# Patient Record
Sex: Female | Born: 1960 | Race: White | Hispanic: No | Marital: Married
Health system: Midwestern US, Academic
[De-identification: ages and names within clinical notes are randomized; demographics above are authoritative.]

---

## 2017-08-16 ENCOUNTER — Encounter: Admit: 2017-08-16 | Discharge: 2017-08-17 | Payer: Commercial Managed Care - HMO

## 2017-10-25 ENCOUNTER — Encounter: Admit: 2017-10-25 | Discharge: 2017-10-26 | Payer: Commercial Managed Care - HMO

## 2017-12-18 ENCOUNTER — Encounter: Admit: 2017-12-18 | Discharge: 2017-12-18 | Payer: Commercial Managed Care - HMO

## 2017-12-18 DIAGNOSIS — M79641 Pain in right hand: Principal | ICD-10-CM

## 2017-12-26 ENCOUNTER — Ambulatory Visit: Admit: 2017-12-26 | Discharge: 2017-12-26 | Payer: Commercial Managed Care - HMO

## 2017-12-26 ENCOUNTER — Encounter: Admit: 2017-12-26 | Discharge: 2017-12-26 | Payer: Commercial Managed Care - HMO

## 2017-12-26 DIAGNOSIS — M359 Systemic involvement of connective tissue, unspecified: ICD-10-CM

## 2017-12-26 DIAGNOSIS — M79641 Pain in right hand: Principal | ICD-10-CM

## 2017-12-26 DIAGNOSIS — T4145XA Adverse effect of unspecified anesthetic, initial encounter: Principal | ICD-10-CM

## 2017-12-26 DIAGNOSIS — M79642 Pain in left hand: ICD-10-CM

## 2018-01-27 ENCOUNTER — Encounter: Admit: 2018-01-27 | Discharge: 2018-01-27 | Payer: Commercial Managed Care - HMO

## 2019-04-12 IMAGING — MG MAMMOGRAM 3D SCREEN, BILATERAL
12 of 16 series · 12 of 16 positions shown · non-contrast
Comparison: none

[R CC (1 of 2)]
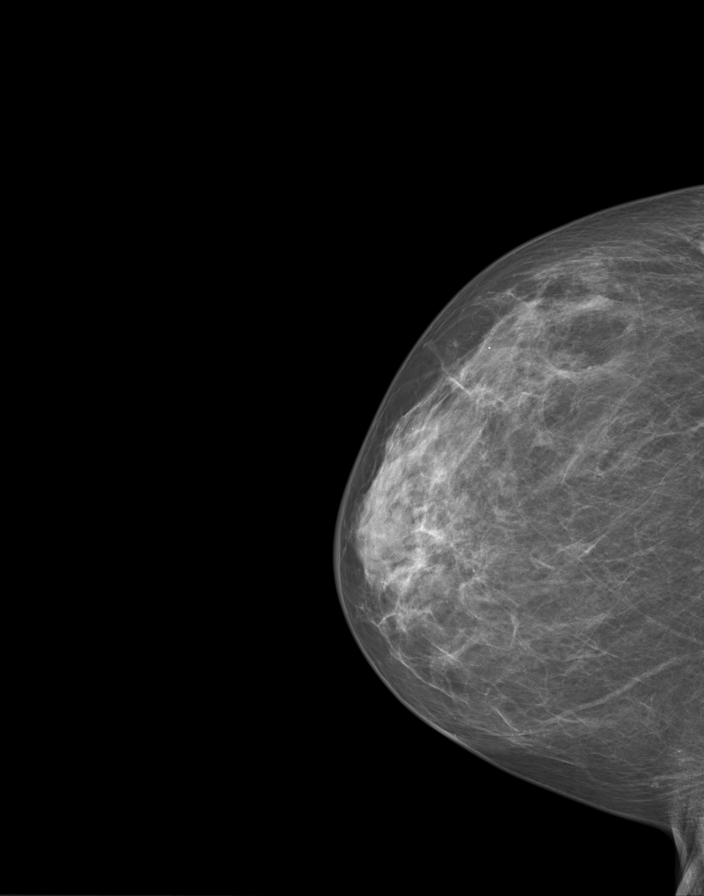

[R tomo (1 of 2)]
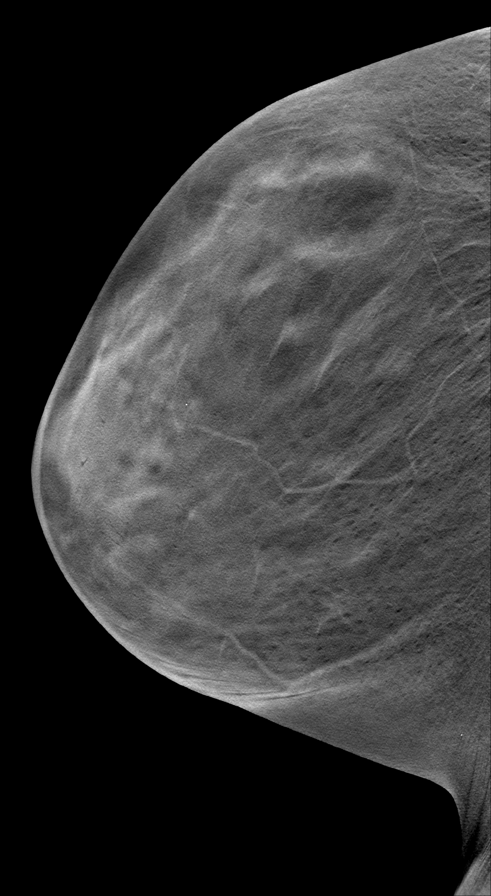

[R CC (2 of 2)]
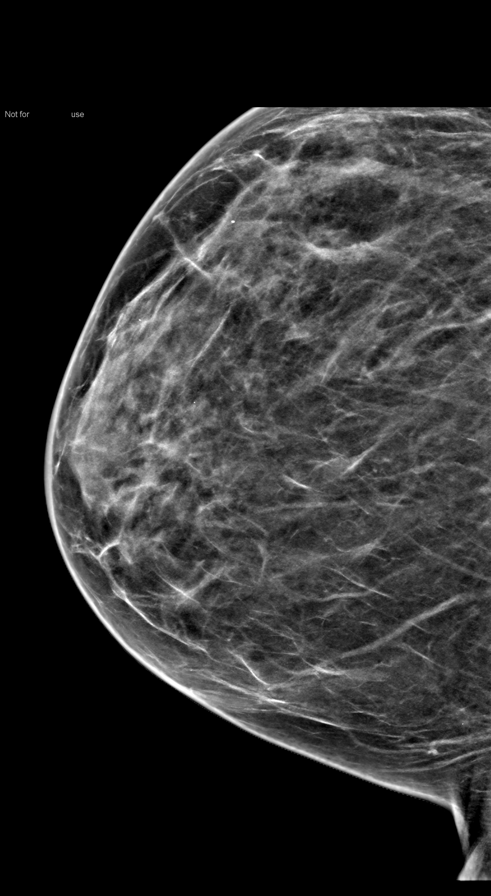

[R (1 of 2)]
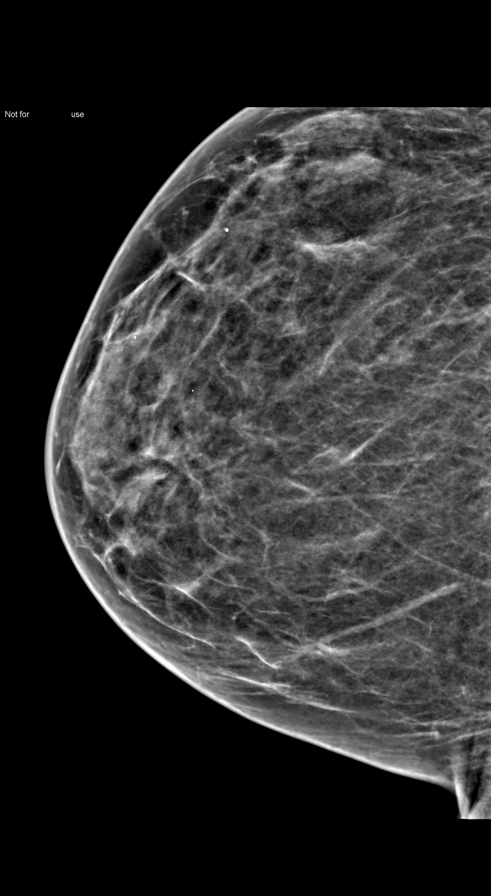

[L CC (1 of 2)]
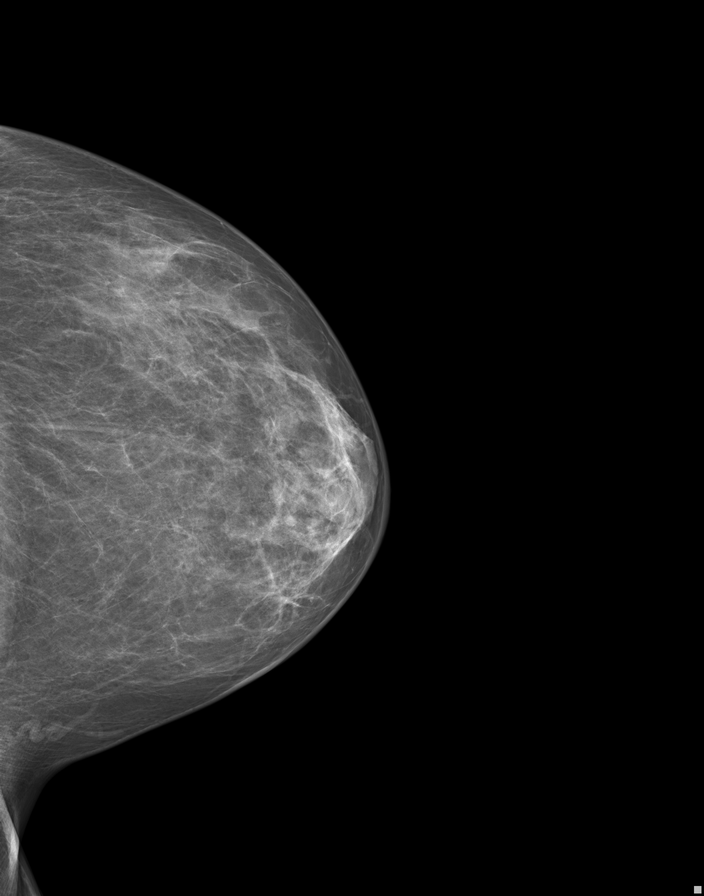

[L tomo]
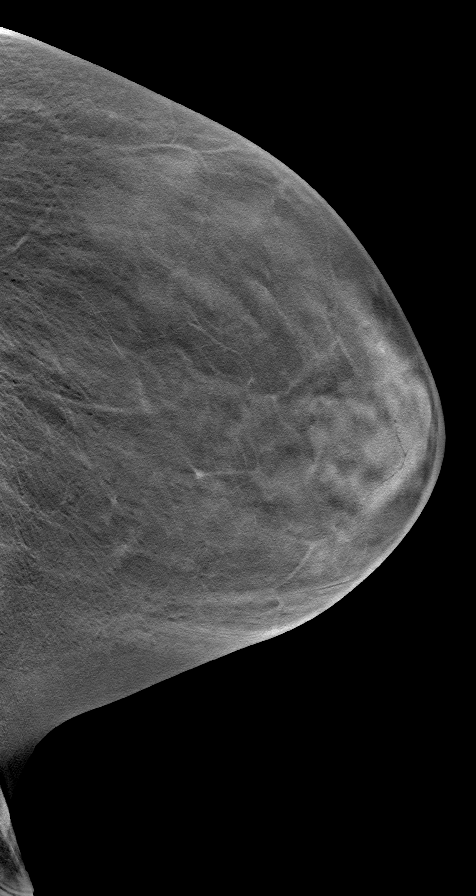

[L CC (2 of 2)]
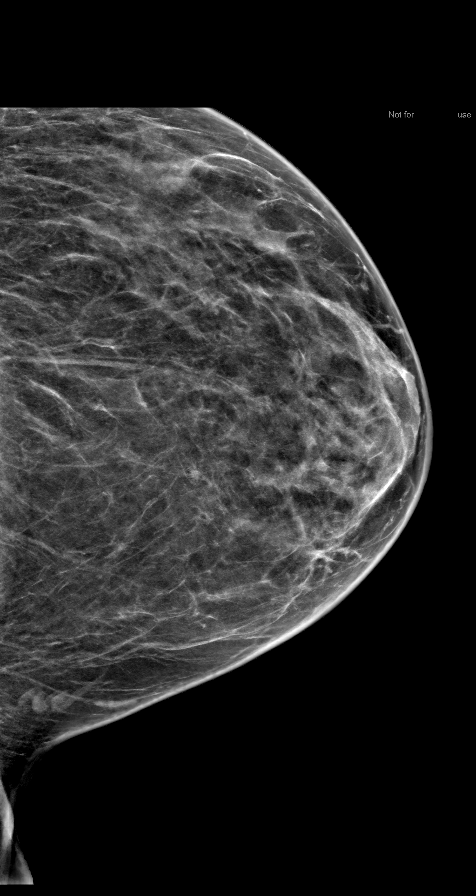

[L]
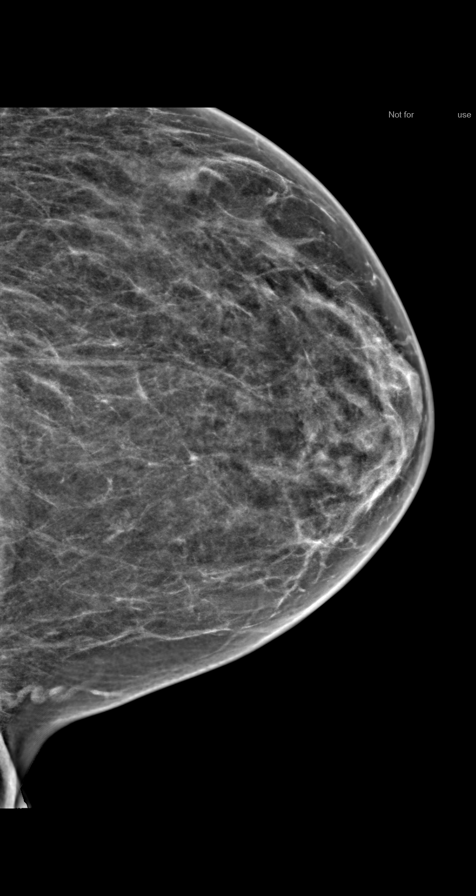

[R MLO (1 of 2)]
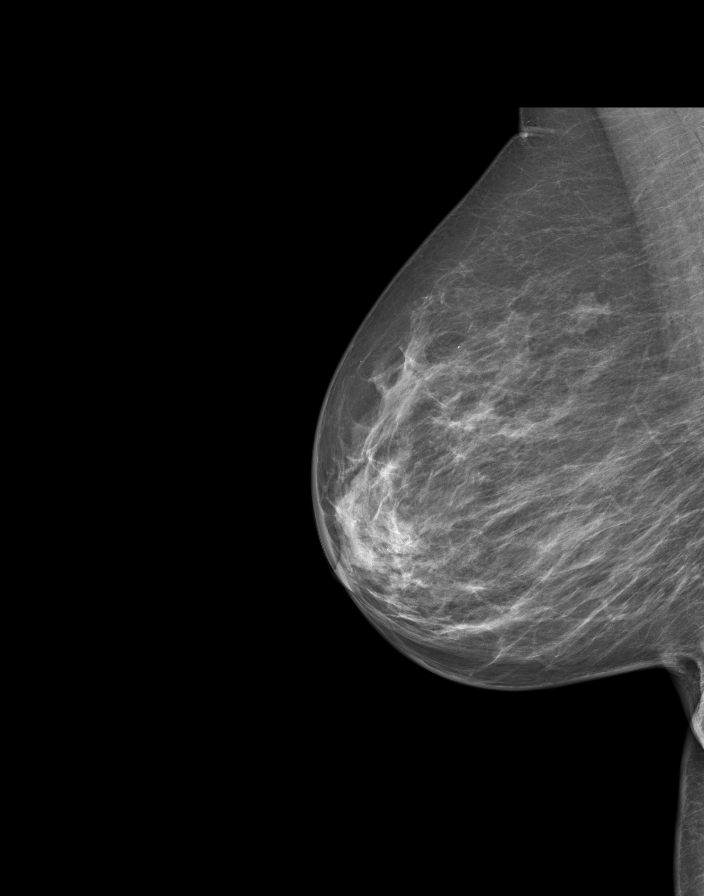

[R tomo (2 of 2)]
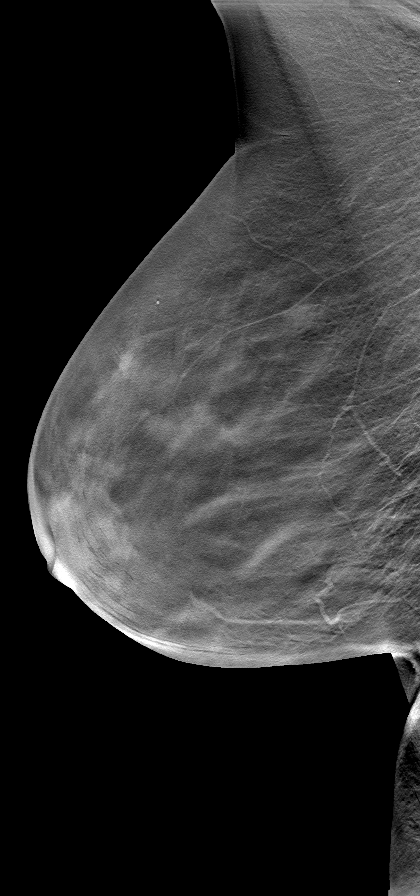

[R MLO (2 of 2)]
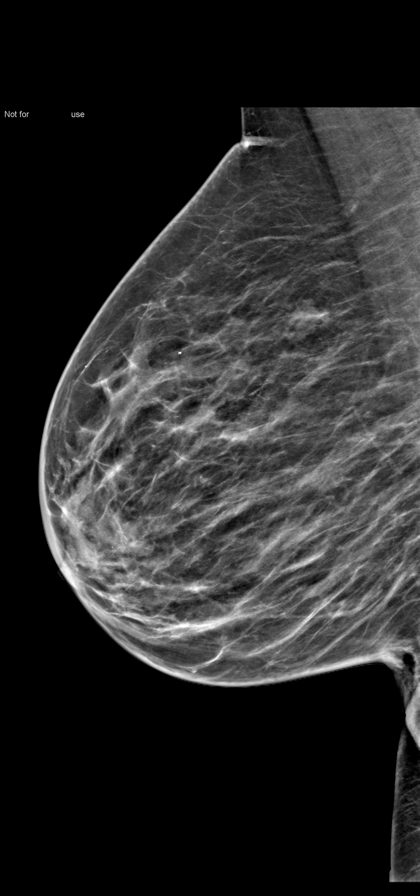

[R (2 of 2)]
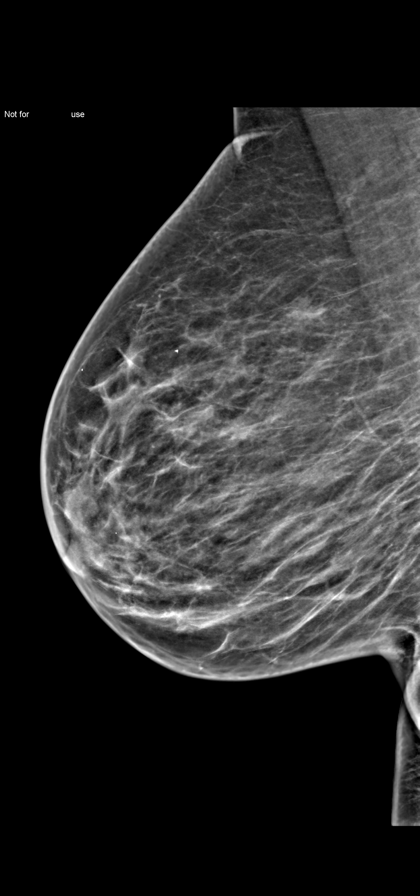

[12 of 16 positions shown; findings below may reference images not displayed]

EXAM
3D SCREENING MAMMOGRAM, BILATERAL

INDICATION
screening
Sister DX @ 60, 2 PAT AUNTS @ 50s, PAT GMA @50. INVERTED NIPPLES NOT NEW, DISCHARGE OFF AND ON.
SKIN CA 5449. SCR (3D) AB PRIORS: BEING REQUESTED FERIENHAUS ERXLEBEN

TECHNIQUE
Digital 2D CC and MLO projections obtained with 3D tomographic views per manufacturer's protocol.
ICAD version 7.2 was used during this exam.

COMPARISONS
January 02, 2017

FINDINGS
The breasts are stable and unchanged. No concerning masses or calcifications are seen.

IMPRESSION
Stable bilateral mammography. One year follow-up recommended.BI-RADS 1, NEGATIVE.

Tech Notes:

## 2019-07-08 ENCOUNTER — Ambulatory Visit: Admit: 2019-07-08 | Discharge: 2019-07-08 | Payer: Commercial Managed Care - HMO

## 2019-07-08 ENCOUNTER — Encounter: Admit: 2019-07-08 | Discharge: 2019-07-08 | Payer: Commercial Managed Care - HMO

## 2019-07-08 DIAGNOSIS — R159 Full incontinence of feces: Secondary | ICD-10-CM

## 2019-07-08 DIAGNOSIS — M359 Systemic involvement of connective tissue, unspecified: Secondary | ICD-10-CM

## 2019-07-08 DIAGNOSIS — R300 Dysuria: Secondary | ICD-10-CM

## 2019-07-08 DIAGNOSIS — T8859XA Other complications of anesthesia, initial encounter: Secondary | ICD-10-CM

## 2019-07-08 DIAGNOSIS — R3989 Other symptoms and signs involving the genitourinary system: Secondary | ICD-10-CM

## 2019-07-08 DIAGNOSIS — M7918 Myalgia, other site: Secondary | ICD-10-CM

## 2019-07-08 DIAGNOSIS — N3946 Mixed incontinence: Secondary | ICD-10-CM

## 2019-07-08 DIAGNOSIS — N952 Postmenopausal atrophic vaginitis: Secondary | ICD-10-CM

## 2019-07-08 MED ORDER — ESTRADIOL 0.01 % (0.1 MG/GRAM) VA CREA
2 refills | 43.00000 days | Status: AC
Start: 2019-07-08 — End: ?

## 2019-07-08 MED ORDER — METHEN-M.BLUE-S.PHOS-PHSAL-HYO 118-10-40.8-36 MG PO CAP
1 | ORAL_CAPSULE | Freq: Four times a day (QID) | ORAL | 1 refills | 57.00000 days | Status: DC | PRN
Start: 2019-07-08 — End: 2019-08-13

## 2019-07-09 ENCOUNTER — Encounter: Admit: 2019-07-09 | Discharge: 2019-07-09 | Payer: Commercial Managed Care - HMO

## 2019-07-09 NOTE — Telephone Encounter
Rec'd incoming fax from pt's pharmacy stating patient requests new Rx- high copay on the Uro-MP- any alternative?

## 2019-07-09 NOTE — Telephone Encounter
Hand, Monica L, PA-C  You 51 minutes ago (2:10 PM)     She is not on Medicare so might be able to try Goodrx     Spoke to pt. Suggested GoodRx and possibly uribel coupon in our office. GoodRx showed coupons available via [facility]. Pt will try this.

## 2019-07-09 NOTE — Progress Notes
Faxed PT referral to [facility]- fx [phone]. Received successful confirmation.

## 2019-07-11 ENCOUNTER — Encounter: Admit: 2019-07-11 | Discharge: 2019-07-11 | Payer: Commercial Managed Care - HMO

## 2019-07-11 MED ORDER — LEVOFLOXACIN 250 MG PO TAB
250 mg | ORAL_TABLET | Freq: Every day | ORAL | 0 refills | 7.00000 days | Status: AC
Start: 2019-07-11 — End: ?

## 2019-07-11 NOTE — Telephone Encounter
Called patient to discuss her results of her urine culture. ( see lab results) .

## 2019-07-17 ENCOUNTER — Encounter: Admit: 2019-07-17 | Discharge: 2019-07-17 | Payer: Commercial Managed Care - HMO

## 2019-07-17 ENCOUNTER — Ambulatory Visit: Admit: 2019-07-17 | Discharge: 2019-07-17 | Payer: Commercial Managed Care - HMO

## 2019-07-17 LAB — COMPREHENSIVE METABOLIC PANEL
Lab: 0.6 mg/dL (ref 0.3–1.2)
Lab: 0.7 mg/dL (ref 0.4–1.00)
Lab: 101 MMOL/L (ref 98–110)
Lab: 139 MMOL/L (ref 137–147)
Lab: 26 mg/dL — ABNORMAL HIGH (ref 7–25)
Lab: 28 U/L (ref 7–40)
Lab: 31 MMOL/L — ABNORMAL HIGH (ref 21–30)
Lab: 33 U/L (ref 7–56)
Lab: 4.1 MMOL/L (ref 3.5–5.1)
Lab: 4.2 g/dL (ref 3.5–5.0)
Lab: 49 U/L (ref 25–110)
Lab: 6.9 g/dL (ref 6.0–8.0)
Lab: 9.5 mg/dL (ref 8.5–10.6)
Lab: 94 mg/dL (ref 70–100)
Lab: >60 mL/min (ref >60)
Lab: >60 mL/min (ref >60)
Lab: 7 (ref 3–12)

## 2019-07-17 MED ORDER — PHENAZOPYRIDINE 200 MG PO TAB
200 mg | ORAL_TABLET | Freq: Three times a day (TID) | ORAL | 1 refills | Status: DC | PRN
Start: 2019-07-17 — End: 2019-08-13

## 2019-07-17 NOTE — Progress Notes
Please contact pt with result.  CMP shows normal kidney function. BUN just slightly elevated, but other markers being normal is reassuring. Please ask her to increase her water intake.   For UTI, please RX levaquin 750 mg once daily x 5 days. If she still does not feel better after 48 hours of increased dose, please instruct her to call office.

## 2019-07-17 NOTE — Progress Notes
Pertinent Review of Systems:  - General ROS: Denies recent weight change above 10 pounds, Denies malaise  - HEENT ROS: Denies blurring of vision. Denies double vision, Denies glaucoma, Denies dry eyes  - Cardiovascular ROS: Denies chest pain. Denies palpitations currently. Denies orthopnea  - Respiratory ROS: Denies shortness of breath,  Denies cough,  Denies wheezing currently  - Gastrointestinal ROS: Denies constipation, Denies gastric reflux,  Denies blood in stool, Denies diarrhea, Denies Irritable bowel syndrome, Denies nausea, Denies vomiting, Denies abdominal pain  - Endocrine ROS: DeniesPOS polydipsia,  Denies excessive sweating. DeniesPOS hot flashes. Denies Thyroid disease  - Hematological and Lymphatic ROS: Denies easy bruisability , Denies current anemia, Denies adenopathy in the inguinal area  - Neurological ROS: Denies syncope, Denies numbness in lower extremities, Denies neuropathy. Denies shooting pain down the legs, Denies shooting pain in the lower back, DeniesPOS low back pain  - Musculoskeletal ROS: Denies Joint pain. Denies Edema in lower extremities. DeniesPOS fibromyalgia  - Psychological ROS: Denies depression, Denies anxiety, Denies thoughts of suicide, Denies thoughts of homicide, Denies recent seizure, Denies syncope   - Dermatological ROS: Denies eczema. Denies skin rashes in the groin area and other sites

## 2019-07-17 NOTE — Progress Notes
CHIEF COMPLAINT:   Chief Complaint   Patient presents with   ? Bladder infection       Janice Gregory is a 59 y.o.,  female, who presents for follow up regarding UTI.    On day 6/7 of levaquin and UTI symptoms persisting with dysuria, bladder pain (esp with hitting bumps in the road on drive here), increased urinary urgency and frequency. Denies gross hematuria. Afebrile.    We had discussed possibly administering gentamicin injection today. She reports her last blood work showed elev Cr, BUN and low eGFR, which was attributed possibly to NSAIDS. She has not had follow-up blood work since then.       ALLERGIES:  Patient has no known allergies.    MEDICATIONS:    Current Outpatient Medications:   ?  ASTAXANTHIN PO, Take 12 mg by mouth., Disp: , Rfl:   ?  diclofenac (VOLTAREN) 1 % topical gel, Apply 4 g topically to affected area four times daily., Disp: , Rfl:   ?  estradioL (ESTRACE) 0.01 % (0.1 mg/g) vaginal cream, For 14 days, insert/apply 0.5 g (1/2 fingertip unit) intravaginally and to vaginal opening once nightly. Then, apply 1 g (1 fingertip unit) in the same way twice weekly at bedtime., Disp: 42.5 g, Rfl: 2  ?  FLAXSEED OIL PO, Take  by mouth., Disp: , Rfl:   ?  levoFLOXacin (LEVAQUIN) 250 mg tablet, Take one tablet by mouth daily for 7 days., Disp: 7 tablet, Rfl: 0  ?  methenamine-sod phos-phenyl salicy-methylene blue-hyoscy (URIBEL) 118-10-40.8-36 mg capsule, Take one capsule by mouth four times daily as needed., Disp: 100 capsule, Rfl: 1  ?  mv-mn-folic ac-vit K-herb 289 (ALIVE ONCE DAILY WOMEN 50 PLUS) 800-100 mcg tab, Take  by mouth., Disp: , Rfl:   ?  phenazopyridine (PYRIDIUM) 200 mg tablet, Take one tablet by mouth three times daily as needed for Pain. Take after meals for up to 2 days., Disp: 12 tablet, Rfl: 1  ?  polyethylene glycol 3350 (MIRALAX) 17 g packet, Take 17 g by mouth daily., Disp: , Rfl:   ?  Safflower Oil-Linoleic Acid,Co (CLA) 1,000 mg cap, Take  by mouth., Disp: , Rfl:   ?  safflower oil/linoleic acid,co (CLA PO), Take 1,300 mg by mouth twice daily., Disp: , Rfl:   ?  traMADol (ULTRAM) 50 mg tablet, Take 50 mg by mouth every 6 hours as needed for Pain., Disp: , Rfl:   ?  vit C/E/zinc/lutein/zeaxanthin (OCUVITE EYE HEALTH PO), Take  by mouth., Disp: , Rfl:     Medical History:   Diagnosis Date   ? Complication of anesthesia    ? 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 ARTHROPLASTY     ? FOOT SURGERY     ? HERNIA REPAIR     ? HX APPENDECTOMY     ? HX CHOLECYSTECTOMY     ? HX HEMORRHOIDECTOMY     ? HX JOINT REPLACEMENT     ? HYSTERECTOMY     ? KNEE SURGERY         OBJECTIVE:    Vitals:    07/17/19 1024   BP: 128/73   BP Source: Arm, Right Upper   Patient Position: Sitting   Pulse: 79   Resp: 16   Temp: 36.9 ?C (98.4 ?F)   SpO2: 95%   Weight: 78.7 kg (173 lb 6.4 oz)   Height: 170.2 cm (  67)   PainSc: One       Physical Exam  Constitutional:       General: She is not in acute distress.     Appearance: Normal appearance. She is not ill-appearing.   HENT:      Head: Normocephalic and atraumatic.   Cardiovascular:      Rate and Rhythm: Normal rate.   Pulmonary:      Effort: Pulmonary effort is normal. No respiratory distress.   Neurological:      General: No focal deficit present.      Mental Status: She is alert.   Psychiatric:         Mood and Affect: Mood normal.         Behavior: Behavior normal.         Thought Content: Thought content normal.         Judgment: Judgment normal.   Vitals signs reviewed.          IMPRESSION  59 y.o. female, with history of low eGFR per pt report, with acute cystitis with urine culture positive for pseudomonas aeruginosa and E coli, symptoms persisting with treatment with Levaquin 250 once daily.     PLAN  CMP to follow-up pt report of decreased kidney function. Had considered gentamicin injection, but given potential for nephrotoxicity, will defer until CMP result. If eGFR/Cr WNL, will rx levaquin 750 mg once daily. Per review of literature regarding pseudomonas aeruginosa infection, may require higher dose of fluoroquinolone, so I suspect our initial dose was too low. If she again does not improve after 48 hours of levaquin, will consider upper tract imaging for evaluation of other cause and/or ID consultation.   For symptomatic relief, RX phenazopyridine (PYRIDIUM) 200 mg tablet; Take one tablet by mouth three times daily as needed for Pain. Take after meals for up to 2 days.  Dispense: 12 tablet; Refill: 1. If cost-prohibitive, she was instructed to purschase Azo urinary pain relief OTC.   Repeat urine c/s given continued UTI symptoms

## 2019-07-18 ENCOUNTER — Encounter: Admit: 2019-07-18 | Discharge: 2019-07-18 | Payer: Commercial Managed Care - HMO

## 2019-07-18 MED ORDER — LEVOFLOXACIN 750 MG PO TAB
750 mg | ORAL_TABLET | Freq: Every day | ORAL | 0 refills | 7.00000 days | Status: AC
Start: 2019-07-18 — End: ?

## 2019-07-18 NOTE — Telephone Encounter
-----   Message from Dalene Seltzer, PA-C sent at 07/18/2019 12:26 PM CST -----  Please contact pt with result. Urine culture is negative. It's reassuring that the infection is no longer culturing and there are no new bacteria, but I would like her to continue levaquin to finish treating the prior positive culture. I would anticipate her symptoms will start to improve now!

## 2019-07-18 NOTE — Telephone Encounter
Spoke to pt. Relayed results. Pt was appreciative.

## 2019-07-18 NOTE — Telephone Encounter
-----   Message from Dalene Seltzer, PA-C sent at 07/17/2019  3:12 PM CST -----  Please contact pt with result.  CMP shows normal kidney function. BUN just slightly elevated, but other markers being normal is reassuring. Please ask her to increase her water intake.   For UTI, please RX levaquin 750 mg once daily x 5 days. If she still does not feel better after 48 hours of increased dose, please instruct her to call office.

## 2019-07-18 NOTE — Telephone Encounter
Spoke to pt. Relayed results and tx recommendations. Pt will call this office if not feeling better after 48 hours. Pt was appreciative.

## 2019-07-22 ENCOUNTER — Encounter: Admit: 2019-07-22 | Discharge: 2019-07-22 | Payer: Commercial Managed Care - HMO

## 2019-07-22 NOTE — Telephone Encounter
Pt LM stating she has tab of Levaquin left and she is really, no better.     Spoke to pt. Pt confirmed she has had no changes. [place] [employer] [place] and had bladder, throbbing and spasms. Pt stated she could barely make it back to the house before she had to urinate again, even with the red pill (AZO). Advised will discuss with [facility]. Pt v/u.

## 2019-07-25 ENCOUNTER — Encounter: Admit: 2019-07-25 | Discharge: 2019-07-25 | Payer: Commercial Managed Care - HMO

## 2019-07-25 DIAGNOSIS — R3989 Other symptoms and signs involving the genitourinary system: Principal | ICD-10-CM

## 2019-07-25 LAB — URINALYSIS DIPSTICK
Lab: 6 (ref 5.0–8.0)
Lab: NEGATIVE
Lab: 1 (ref 1.003–1.035)
Lab: NEGATIVE
Lab: NEGATIVE
Lab: NEGATIVE
Lab: NEGATIVE
Lab: NEGATIVE
Lab: NEGATIVE
Lab: POSITIVE — AB

## 2019-07-25 LAB — URINALYSIS, MICROSCOPIC

## 2019-08-04 ENCOUNTER — Encounter: Admit: 2019-08-04 | Discharge: 2019-08-04 | Payer: Commercial Managed Care - HMO

## 2019-08-13 ENCOUNTER — Encounter: Admit: 2019-08-13 | Discharge: 2019-08-13 | Payer: Commercial Managed Care - HMO

## 2019-08-13 ENCOUNTER — Ambulatory Visit: Admit: 2019-08-13 | Discharge: 2019-08-13 | Payer: Commercial Managed Care - HMO

## 2019-08-13 DIAGNOSIS — K5900 Constipation, unspecified: Secondary | ICD-10-CM

## 2019-08-13 DIAGNOSIS — T8859XA Other complications of anesthesia, initial encounter: Secondary | ICD-10-CM

## 2019-08-13 DIAGNOSIS — R194 Change in bowel habit: Secondary | ICD-10-CM

## 2019-08-13 DIAGNOSIS — M359 Systemic involvement of connective tissue, unspecified: Secondary | ICD-10-CM

## 2019-08-13 DIAGNOSIS — K5909 Other constipation: Secondary | ICD-10-CM

## 2019-08-13 DIAGNOSIS — R159 Full incontinence of feces: Secondary | ICD-10-CM

## 2019-08-13 DIAGNOSIS — Z9889 Other specified postprocedural states: Secondary | ICD-10-CM

## 2019-08-13 NOTE — Progress Notes
Date of Service: 08/13/2019    Subjective:             Janice Gregory is a 59 y.o. female. 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 She is referred for evaluation of fecal incontinence.PCP; Nicole Cella MD    History of Present Illness  Patient reports longstanding history of severe constipation with 1 bowel movement per week since her early adulthood, especially worsened after her vaginal deliveries.  She has had 3x vaginal births, traumatic birth 10 lbs and forceps use, with second birth 12 months later. 3rd birth was a 3 day induction.  She reports using for fingers and occasionally hand for disimpaction, she also reports issues with vaginal prolapse, undergoing surgeries for that in addition to her surgeries for rectopexy.  She now presents with fecal incontinence that has been very bothersome and disruptive to her quality of life preventing her from going out of the house. She has fecal passage with any type of strenuous work. The fecal incontinence has been getting worse over the last 3 years, and over the last 3 weeks she has had BSS 6-7 almost constantly.  She reports passage of stool each time she urinates (about 20x/d).  She developed fecal incontinence already 2 years prior to her second rectopexy in 2018, and was told by her surgeon that this may get even worse after the second surgery.  To help prevent her from developing severe constipation again, she was told that he had tightened her rectal vault, since then she has been having twizzler size BMs until recently as described above.  Over the last 20 years she has been taking a capful of Miralax on a daily basis.  Since 01/2019 started to develop UTIs and started to see urogynecology at [facility] in March and has been referred to pelvic floor therapy. Past abdominal/pelvic surgeries:  November 1997 [facility] for menorrhagia in [place] complicated by uterine prolapse, hematoma and extensive abdominal infection.  Also underwent hysterectomy at same time as D&C due to prolapse.  December 1997 exploratory laparotomy for abdominal infection and appendectomy  August 1998 incisional hernia repair  July 2000 anterior and posterior vaginal repair  March 2004 rectocele repair, rectopexy, anterior sphincter repair, transvaginal sling.  C/b extensive adhesions per pt.   November 2009 hemorrhoidectomy  July 10, 2016 rectocele repair Dr. Wynelle Link of [facility]  August 07, 2016 hemorrhoidectomy Dr. Shari Heritage of [facility]  January 2002 cholecystectomy c/b adhesions        Medical History:   Diagnosis Date   ? Complication of anesthesia    ? 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 ARTHROPLASTY     ? FOOT SURGERY     ? HERNIA REPAIR     ? HX APPENDECTOMY     ? HX CHOLECYSTECTOMY     ? HX HEMORRHOIDECTOMY     ? HX JOINT REPLACEMENT     ? HYSTERECTOMY     ? KNEE SURGERY         Social History     Socioeconomic History   ? Marital status: Married     Spouse name: Not on file   ? Number of children: Not on file   ? Years of education: Not on file   ?  Highest education level: Not on file   Occupational History   ? Not on file   Tobacco Use   ? Smoking status: Former Smoker     Quit date: 05/08/1977     Years since quitting: 42.2   ? Smokeless tobacco: Never Used   Substance and Sexual Activity   ? Alcohol use: Never     Frequency: Never   ? Drug use: Never   ? Sexual activity: Not on file   Other Topics Concern   ? Not on file   Social History Narrative   ? Not on file       family history includes Arthritis in her mother and sibling; Heart Attack in her father; Osteoporosis in her mother and sibling; Scoliosis in her sibling.    No Known Allergies      Review of Systems   All other systems reviewed and are negative.        Objective:         ? Black Cohosh 540 mg cap Take  by mouth daily.   ? COLLAGEN MISC Use 2 capsules as directed daily.   ? Cranberry Extract 500 mg cap Take  by mouth daily.   ? duloxetine DR (CYMBALTA) 60 mg capsule Take 120 mg by mouth at bedtime daily.   ? estradioL (ESTRACE) 0.01 % (0.1 mg/g) vaginal cream For 14 days, insert/apply 0.5 g (1/2 fingertip unit) intravaginally and to vaginal opening once nightly. Then, apply 1 g (1 fingertip unit) in the same way twice weekly at bedtime.   ? gabapentin 300 mg Tb24 Take  by mouth. 2-3 timer per day   ? Lactobac no.41/Bifidobact no.7 (PROBIOTIC-10 PO) Take  by mouth daily. 20 Billion   ? lifitegrast (XIIDRA) 5 % ophthalmic drop Apply 1 drop to both eyes every 12 hours.   ? MELATONIN PO Take 20 mg base by mouth at bedtime daily.   ? Methenam/Benz Ac/Salicy/Sal-am (CYSTEX PO) Take  by mouth. 2 tabs po qd   ? mv-mn-folic ac-vit K-herb 289 (ALIVE ONCE DAILY WOMEN 50 PLUS) 800-100 mcg tab Take  by mouth.   ? other medication 1 Dose. Liposomal Vitamin C 1600 mg Sig: 2 per day   ? other medication 1 Dose. Forskolin Extract 3000 mg Sig: 2 caps po bid   ? pilocarpine (SALAGEN (PILOCARPINE)) 5 mg tablet Take 5 mg by mouth. One tablet po four times per day   ? polyethylene glycol 3350 (MIRALAX) 17 g packet Take 17 g by mouth daily.   ? Safflower Oil-Linoleic Acid,Co (CLA) 1,000 mg cap Take  by mouth.   ? safflower oil/linoleic acid,co (CLA PO) Take 1,300 mg by mouth twice daily.     Vitals:    08/13/19 1457   BP: 131/79   BP Source: Arm, Left Upper   Patient Position: Sitting   Pulse: 71   Weight: 79.1 kg (174 lb 6.4 oz)   Height: 175.3 cm (69)   PainSc: Five     Body mass index is 25.75 kg/m?Marland Kitchen     Physical Exam  Constitutional:       General: She is not in acute distress.     Appearance: She is well-developed. She is not diaphoretic.   HENT:      Head: Normocephalic.   Eyes:      General: No scleral icterus.     Conjunctiva/sclera: Conjunctivae normal.      Pupils: Pupils are equal, round, and reactive to light.   Neck:  Musculoskeletal: Normal range of motion.   Cardiovascular:      Rate and Rhythm: Normal rate and regular rhythm.      Heart sounds: Normal heart sounds. No murmur. No friction rub. No gallop.    Pulmonary:      Effort: Pulmonary effort is normal.      Breath sounds: Normal breath sounds. No wheezing or rales.   Abdominal:      General: Bowel sounds are normal. There is no distension.      Palpations: Abdomen is soft. There is no mass.      Tenderness: There is no abdominal tenderness. There is no guarding or rebound.      Comments: Rectal exam shows competent sphincter with good sphincter pressure and external hemorrhoids.  There is evidence of fecal smearing.   Musculoskeletal: Normal range of motion.         General: No tenderness.   Lymphadenopathy:      Cervical: No cervical adenopathy.   Skin:     General: Skin is warm and dry.      Findings: No erythema.   Neurological:      Mental Status: She is alert and oriented to person, place, and time.      Cranial Nerves: No cranial nerve deficit.              Assessment and Plan:  Kemya Philbert is a 59 y.o. female. 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 She is referred for evaluation of fecal incontinence.    # Hx of longstanding severe constipation, on daily Miralax  # Multiple pelvic surgeries including vaginal sling, rectopexy x2, anterior sphincter repair. Operations c/b extensive adhesions  # fecal incontinence, almost complete  # recurrent UTIs    This is a very complicated situation with almost burnt out disease.  She has had longstanding severe constipation, significant injury to her pelvic floor with traumatic vaginal deliveries, multiple surgeries with adhesion buildup, as well as laxity of the pelvic floor muscles and tissue.  We will check anorectal manometry and MRI defecography to look at the motility and anatomy of her pelvic floor to see if there is any other identifiable issues that can be addressed.  Otherwise, we have discussed with the patient the possible need for diverting loop colostomy that would possibly be able to provide her with better quality of life.  We would like her to be evaluated by colorectal surgery and to speak to an ostomy nurse to help prepare her in case she would go that route.    We also will check stool pathogens, to rule out the possibility of C. difficile leading to fecal incontinence and worsening of her symptoms.    The patient was seen and the plan was discussed with Dr. Burnis Medin.    Renea Ee, MD  Gastroenterology & Hepatology Fellow  Pager [phone]                       Very complicated problem.  Long satnding constipation and Fecal incontinence, traumatic vaginal delivery. Hx of vaginal prolapse and rectal prolapse. She had multiple pelvic surgeries (see below), 2 rectopexy in 2004 and 2018. She is major Fecal incontinence for the past 3 weeks, she isn't able to differentiate between gas or stool. It's affecting her QOL  and makes her very frustrated. She uses Miralax 1 cap a day.   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  She has had 2 prior surgeries for vaginal and rectal prolapse.  Based on her history (review Dr. Jae Dire note), I am suspicious if she would have any improvement with biofeedback therapy, or more surgeries.  We reviewed pelvic floor anatomy, rectocele, rectal prolapse, colostomy through Internet review.  I suggested her to discuss with ostomy wound care nurse and or colorectal surgeon in regard to colostomy for management of severe major fecal incontinence.  She has had multiple abdominal surgeries which makes finding an ostomy site difficult.    November 1997 D&C for menorrhagia in [place] complicated by uterine prolapse, hematoma and extensive abdominal infection.  Also underwent hysterectomy at same time as D&C due to prolapse.  December 1997 exploratory laparotomy for abdominal infection and appendectomy  August 1998 incisional hernia repair  July 2000 anterior and posterior vaginal repair  March 2004 rectocele repair, rectopexy, anterior sphincter repair, transvaginal sling.  C/b extensive adhesions per pt.   November 2009 hemorrhoidectomy  July 10, 2016 rectocele repair Dr. Wynelle Link of [facility]  August 07, 2016 hemorrhoidectomy Dr. 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 I performed the key portions of the E/M visit, discussed case with fellow and concur with fellow documentation of history, physical exam, assessment, and treatment plan unless otherwise noted.  we explained the diagnosis and management plan in detail. No barrier to education was noted. she understood me well and repeated her understanding. I answered all her questions and concerns.     Jolee Ewing, MD     This note was in part completed with Dragon, a voice to text dictation system. Some errors may have occured and persist despite my best efforts to edit this document to eliminate dictation/translation?related errors.??If you have questions/concerns, please contact me for clarification         Staff name:  Jolee Ewing, MD Date:  08/13/2019

## 2019-08-19 ENCOUNTER — Encounter: Admit: 2019-08-19 | Discharge: 2019-08-19 | Payer: Commercial Managed Care - HMO

## 2019-08-20 ENCOUNTER — Ambulatory Visit: Admit: 2019-08-20 | Discharge: 2019-08-20 | Payer: Commercial Managed Care - HMO

## 2019-08-20 ENCOUNTER — Encounter: Admit: 2019-08-20 | Discharge: 2019-08-20 | Payer: Commercial Managed Care - HMO

## 2019-08-20 DIAGNOSIS — R159 Full incontinence of feces: Secondary | ICD-10-CM

## 2019-08-20 DIAGNOSIS — Z9889 Other specified postprocedural states: Secondary | ICD-10-CM

## 2019-08-20 DIAGNOSIS — K5900 Constipation, unspecified: Secondary | ICD-10-CM

## 2019-09-15 ENCOUNTER — Encounter: Admit: 2019-09-15 | Discharge: 2019-09-15 | Payer: Commercial Managed Care - HMO

## 2019-09-23 ENCOUNTER — Encounter: Admit: 2019-09-23 | Discharge: 2019-09-23 | Payer: Commercial Managed Care - HMO

## 2019-09-23 DIAGNOSIS — R3989 Other symptoms and signs involving the genitourinary system: Secondary | ICD-10-CM

## 2019-09-24 LAB — URINALYSIS DIPSTICK
Lab: NEGATIVE
Lab: 1 (ref 1.003–1.035)
Lab: 7 (ref 5.0–8.0)
Lab: NEGATIVE
Lab: NEGATIVE
Lab: NEGATIVE
Lab: NEGATIVE
Lab: NEGATIVE
Lab: POSITIVE — AB
Lab: POSITIVE — AB

## 2019-09-24 LAB — URINALYSIS, MICROSCOPIC

## 2019-09-25 ENCOUNTER — Encounter: Admit: 2019-09-25 | Discharge: 2019-09-25 | Payer: Commercial Managed Care - HMO

## 2019-09-25 DIAGNOSIS — N3000 Acute cystitis without hematuria: Secondary | ICD-10-CM

## 2019-09-25 MED ORDER — AMOXICILLIN-POT CLAVULANATE 500-125 MG PO TAB
1 | ORAL_TABLET | ORAL | 0 refills | 7.00000 days | Status: AC
Start: 2019-09-25 — End: ?

## 2019-09-30 ENCOUNTER — Ambulatory Visit: Admit: 2019-09-30 | Discharge: 2019-10-01 | Payer: Commercial Managed Care - HMO

## 2019-09-30 ENCOUNTER — Encounter: Admit: 2019-09-30 | Discharge: 2019-09-30 | Payer: Commercial Managed Care - HMO

## 2019-09-30 DIAGNOSIS — T8859XA Other complications of anesthesia, initial encounter: Secondary | ICD-10-CM

## 2019-09-30 DIAGNOSIS — M359 Systemic involvement of connective tissue, unspecified: Secondary | ICD-10-CM

## 2019-10-01 DIAGNOSIS — R3989 Other symptoms and signs involving the genitourinary system: Secondary | ICD-10-CM

## 2019-11-04 ENCOUNTER — Encounter: Admit: 2019-11-04 | Discharge: 2019-11-04 | Payer: Commercial Managed Care - HMO

## 2019-11-06 ENCOUNTER — Encounter: Admit: 2019-11-06 | Discharge: 2019-11-06 | Payer: Commercial Managed Care - HMO

## 2019-11-06 ENCOUNTER — Ambulatory Visit: Admit: 2019-11-06 | Discharge: 2019-11-06 | Payer: Commercial Managed Care - HMO

## 2019-11-06 DIAGNOSIS — T8859XA Other complications of anesthesia, initial encounter: Secondary | ICD-10-CM

## 2019-11-06 DIAGNOSIS — R3989 Other symptoms and signs involving the genitourinary system: Secondary | ICD-10-CM

## 2019-11-06 DIAGNOSIS — R030 Elevated blood-pressure reading, without diagnosis of hypertension: Secondary | ICD-10-CM

## 2019-11-06 DIAGNOSIS — M359 Systemic involvement of connective tissue, unspecified: Secondary | ICD-10-CM

## 2019-11-11 ENCOUNTER — Encounter: Admit: 2019-11-11 | Discharge: 2019-11-11 | Payer: Commercial Managed Care - HMO

## 2019-11-11 ENCOUNTER — Encounter: Admit: 2019-08-19 | Discharge: 2019-08-19 | Payer: Commercial Managed Care - HMO

## 2019-11-11 DIAGNOSIS — T8859XA Other complications of anesthesia, initial encounter: Secondary | ICD-10-CM

## 2019-11-11 DIAGNOSIS — M359 Systemic involvement of connective tissue, unspecified: Secondary | ICD-10-CM

## 2019-11-12 ENCOUNTER — Encounter: Admit: 2019-11-12 | Discharge: 2019-11-12 | Payer: Commercial Managed Care - HMO

## 2019-11-12 DIAGNOSIS — R3989 Other symptoms and signs involving the genitourinary system: Secondary | ICD-10-CM

## 2019-11-13 ENCOUNTER — Encounter: Admit: 2019-11-13 | Discharge: 2019-11-13 | Payer: Commercial Managed Care - HMO

## 2019-11-17 ENCOUNTER — Encounter: Admit: 2019-11-17 | Discharge: 2019-11-17 | Payer: Commercial Managed Care - HMO

## 2019-11-17 DIAGNOSIS — R3989 Other symptoms and signs involving the genitourinary system: Secondary | ICD-10-CM

## 2019-11-17 MED ORDER — SULFAMETHOXAZOLE-TRIMETHOPRIM 800-160 MG PO TAB
1 | ORAL_TABLET | Freq: Two times a day (BID) | ORAL | 0 refills | Status: AC
Start: 2019-11-17 — End: ?

## 2019-11-17 NOTE — Telephone Encounter
Spoke to pt. Relayed + Ecoli results. Bactrim BID X 7 days. Then TOC. Then will decide on amitriptyline. Pt v/u and was appreciative.

## 2019-11-20 ENCOUNTER — Encounter: Admit: 2019-11-20 | Discharge: 2019-11-20 | Payer: Commercial Managed Care - HMO

## 2019-11-25 ENCOUNTER — Encounter: Admit: 2019-11-25 | Discharge: 2019-11-25 | Payer: Commercial Managed Care - HMO

## 2019-11-25 ENCOUNTER — Encounter: Admit: 2019-11-25 | Discharge: 2019-11-25 | Payer: PRIVATE HEALTH INSURANCE

## 2019-11-25 ENCOUNTER — Inpatient Hospital Stay: Admit: 2019-11-25 | Discharge: 2019-11-25 | Payer: PRIVATE HEALTH INSURANCE

## 2019-11-25 DIAGNOSIS — B999 Unspecified infectious disease: Secondary | ICD-10-CM

## 2019-11-25 DIAGNOSIS — R159 Full incontinence of feces: Secondary | ICD-10-CM

## 2019-11-25 DIAGNOSIS — Z9229 Personal history of other drug therapy: Secondary | ICD-10-CM

## 2019-11-25 DIAGNOSIS — M359 Systemic involvement of connective tissue, unspecified: Secondary | ICD-10-CM

## 2019-11-25 DIAGNOSIS — T8859XA Other complications of anesthesia, initial encounter: Secondary | ICD-10-CM

## 2019-11-25 DIAGNOSIS — M1990 Unspecified osteoarthritis, unspecified site: Secondary | ICD-10-CM

## 2019-11-25 MED ORDER — CELECOXIB 100 MG PO CAP
200 mg | Freq: Once | ORAL | 0 refills | Status: CN
Start: 2019-11-25 — End: ?

## 2019-11-25 MED ORDER — PRESURGERY KIT A
PACK | 0 refills | Status: AC
Start: 2019-11-25 — End: ?
  Filled 2019-11-25: qty 1, 1d supply, fill #1

## 2019-11-25 MED ORDER — SODIUM CHLORIDE 0.9 % IV SOLP
250 mL | INTRAVENOUS | 0 refills | Status: CN
Start: 2019-11-25 — End: ?

## 2019-11-25 MED ORDER — CEFAZOLIN IN 0.9% SOD CHLORIDE 2 GRAM/110 ML IVPB
2 g | Freq: Once | INTRAVENOUS | 0 refills | Status: CN
Start: 2019-11-25 — End: ?

## 2019-11-25 MED ORDER — GABAPENTIN 300 MG PO CAP
600 mg | Freq: Once | ORAL | 0 refills | Status: CN
Start: 2019-11-25 — End: ?

## 2019-11-25 MED ORDER — ACETAMINOPHEN 500 MG PO TAB
1000 mg | Freq: Once | ORAL | 0 refills | Status: CN
Start: 2019-11-25 — End: ?

## 2019-11-25 MED ORDER — METRONIDAZOLE 500 MG PO TAB
ORAL_TABLET | 0 refills | Status: DC
Start: 2019-11-25 — End: 2019-12-15
  Filled 2019-11-25: qty 6, 1d supply, fill #1

## 2019-11-25 MED ORDER — NALOXEGOL 25 MG PO TAB
25 mg | Freq: Once | ORAL | 0 refills | Status: CN
Start: 2019-11-25 — End: ?

## 2019-11-25 MED ORDER — NEOMYCIN 500 MG PO TAB
ORAL_TABLET | 0 refills | Status: DC
Start: 2019-11-25 — End: 2019-12-15
  Filled 2019-11-25: qty 6, 1d supply, fill #1

## 2019-11-25 MED ORDER — METRONIDAZOLE IN NACL (ISO-OS) 500 MG/100 ML IV PGBK
500 mg | Freq: Once | INTRAVENOUS | 0 refills | Status: CN
Start: 2019-11-25 — End: ?

## 2019-11-25 NOTE — Patient Instructions
[facility]  Surgery Instructions    Surgery Date: 12/15/2019  (Please be aware that this appointment for your surgery will not be visible in the MyChart portal.)    Location:  ? Your surgery will be held at [facility] ([address], [place], [place] [address].)  ? Where to park on the day of surgery: [address] (located just [place] [address] on [address] [place])  ? Where to check in on the day of surgery: In the Admitting office (located on Level 1 of hospital at the main entrance.       Arrival Time:   The OR scheduling office must finalize the OR schedule before we can provide a time for your surgery. Your surgery start time and arrive times will be called out to you the day before your procedure between 2pm-4:30pm. If you have not heard from the hospital regarding our surgery time by 4:30PM the business day before your surgery, please call [phone].  The day of the procedure, if you cannot keep your appointment, or are delayed, please contact the surgery center immediately at [phone].      COVID-19 Testing:   ? IF YOU HAVE received and completed all required doses for the COVID-19 vaccine at least 2 weeks before the date of your procedure, but no more than 6 months from your last vaccine dose, you WILL NOT need to complete pre-surgical Covid-19 swab testing.   ? If you HAVE NOT completed all required doses for the COVID-19 vaccine, you will be required to complete preoperative Covid-19 swab testing before surgery. An appointment will be scheduled for you to complete this 2-3 days prior to surgery at a [place] testing site. If you are not local to [facility], please check with your physicians nurse about options to complete this at an outside facility. Please remember, NOT ALL hospitals/clinics/testing sites can provide results in a timely manner for presurgical testing. If you are completing testing outside of [place] sites, you will need to complete this approximately 5-7 days prior to surgery.   ? It is strongly recommended that you quarantine yourself after completing this pre-surgical Covid-19 testing, as we want to ensure you are still negative for Covid-19 at the time of your surgery. Do be aware that there are studies showing postoperative complications should patients proceed with surgery while positive for the Covid-19 virus.  ? Should your testing come back positive, your surgery will be cancelled. Should your testing not make be resulted or sent back to [place] prior to surgery, it may result in a delay or rescheduling of your procedure.     Eating and drinking: You must be on a clear liquid diet the entire day prior to surgery. Sips or small amounts of water and clear liquids may be consumed after 11pm, but must be stopped 2 hours before you need to arrive to the hospital the day of your surgery.     Bowel Prep: Full bowel prep (Pre-Surgical Kits: A pre-surgical kit will be delivered directly to your home address. There will be instructions along with products you will need to prepare for surgery. Please open this kit, read the instructions upon it's arrival to your home. If you have not received at least 1.5 week in advance to your surgery, please call the [employer] with [place] at Phone number [phone] or notify your physician's nurse.)    Hygiene:  Shower the night before and  the morning of your procedure with Hibiclens (wipes will be delivered in your pre-surgical kit).    Personal Items:   Do not wear makeup, fingernail polish, lotion, deodorant, or perfume  Remove all metal jewelry and piercings.   Bring a Retail buyer for your glasses, contacts, hearing aids, dentures, etc.     Thing to Bring:    ? Personal identification (ID)   ? Insurance card    Medications:    ? You will be given medication instructions by the Pre-Operative Assessment Clinic [facility]). If you have NOT been scheduled a Pre-Operative Assessment appointment, you will receive a phone call to be ?triaged? by the anesthesia team. Please be sure to speak with the anesthesia team or attend your [facility] appointment if setup for you. If you do not, this may result in cancellation or delay of your surgery.  ? If you did not receive medication instructions, or have questions about any of your current medications, as it relates to the day of your surgery, please call [phone].    Transportation/Visitor Policy:      During the COVID-19 crisis, They will allow 2 visitors during the time of the surgery.     You are to be admitted into the hospital following surgery. If you are in a private room following surgery, two visitors will be allowed to see you at the same time. Only a maximum of 2 different people can see you per day. A visitor will be allowed to stay the night with you once you have been admitted into a hospital room. When you are ready to be discharged, if no visitor is present, they will call your friend/family to notify them when to pick you up from the hospital.    Exceptions include:     ? No visitors allowed for patients with active COVID-19 infections.  ? Only one visitor is allowed in perioperative and procedural waiting rooms during the time of surgery; no visitors allowed in pre/post areas until a patient is transferred to a patient room.   ? Adult inpatients in semiprivate rooms may only have 1 visitor at a time, but may have up to 2 different visitors in a single day. Due to space limitation, if you are in a shared room with another patient, this is to be sure that the other room occupant is allowed to have a visitor as well.   All visitors will be screened upon entry and must:     ? Wear a mask at all times until further notice.  ? Maintain physical distancing of at least six feet from others.  ? Wash hands or use hand sanitizer when entering or exiting patient rooms.     Any visitors who have a fever or other cold- or flu-like symptoms will not be allowed in [facility] [place] [facility] facilities. We will continue to evaluate our recommendations and keep you updated on revisions based on public health guidance.       FMLA:   If you have any FMLA or Short Term Disability Paperwork needs, you may have these faxed to [phone]- Attention to [phone]. Ask to be connected to Einar Pheasant (Nurse for Dr. Daphine Deutscher).. Be sure your name and birthday are on the forms, along with a return fax number for this office to return them to once completed. Please allow up to 7-10 business days for completion of paperwork.     Billing Questions:   A member of the business office will verify your  medical benefits with your insurance company prior to your surgery. If you have questions about understanding the cost of your care please call the Financial Customer Service at [phone].     Enhanced Recovery Program:  You have been enrolled in a special program called Enhanced Recover After Surgery. This Program uses interventions that have been shown to reduce complications like blood clots, pneumonia and infection. It also helps to get you home sooner. Key points are to do the presurgical exercises and walking program (which you will ready about below), drink your nutrition shakes before and after surgery as instructed, get up and walk as much as possible after surgery and follow your hospital nurse's instructions about bathing and gum chewing after surgery.    Presurgical Kits:   ? You will be ordered a presurgical kit. This Presurgical Kit will include products designed to prepare you for surgery and assist in your recovery.   ? If pre-operative antibiotics are indicated for your surgery, these will be included within the kit. There is a small fee for antibiotics included in this kit. Your insurance should help cover the cost of these antibiotics, but whatever is not covered by your insurance, will be your responsibility to pay. Payment method for these antibiotics must be on file before the kit will be shipped to your home. If you receive a call from the [employer], it is likely a they need to enter a payment method in your account before they can ship this kit.)  ? ** If you have not received this kit and it is 1.5 weeks prior to the date of your procedure, please call the [facility] at phone number [phone] to inquire about the status of this kit.    Enhanced Recovery After Surgery Education and Ostomy Videos    1. ERAS overview (colorectal only) Click here to be routed to video or visit [email or web address]  2. Home exercises before your surgery: Click here or visit [email or web address]  3. Exercises before your surgery #2:  Click here or visit [email or web address]  4. Your epidural catheter after surgery: Click here or visit [email or web address]  5. What is an epidural?: Click here or visit [email or web address]  6. How to get out of bed after your surgery: Click here  or visit [email or web address]  7. Activity after your surgery:  Click here or visit [email or web address]  8. What is an ostomy: Click here or visit [email or web address]  9. Choosing your ostomy bag: Click Here to be routed to video or visit [email or web address]  10. Emptying your ostomy pouch: Click here or visit [email or web address]  11. Bathing with your stoma: Click here or visit [email or web address]  12. Managing ostomy complications: Click here or visit [email or web address]    Before Surgery Walking Program:  Your healthcare team would like you to participate in a walking program prior to your upcoming abdominal surgery.   ? This will help to increase your endurance and strength before undergoing surgery.   ? It is recommended that you make time to walk 5 days out of every week.     Beginner Program  Week 1: Begin with a 15-minute walk   5 minute warm up, 5 minute brisk walk, 5 minute cool down   Walk at a light to moderate intensity. You should be able to talk when walking  Add an additional 5 minutes or more every week as able to tolerate   Week 2: 20 minutes   5 minute warm up, 10 minute walk, 5 minute  cool down   Week 3: 25 minutes   5 minute warm up, 10 minute walk, 5 minute cool down  Week 4:30 minutes    5 minute warm up, 15 minute walk, 5 minute cool down   You may break you walks up throughout the day if necessary.   Remember to always rest as necessary and you should discontinue walking if you experience increased pain or dizziness.  Where to walk: You can start with your neighborhood, a park, track, mall, living room, or gym.     **If you have peripheral neuropathy or are unable to walk, a stationary bike may be more appropropriate. Patients undergoing chemotherapy or radiation may have lower tolerance to exercise. A walking program is still recommended however you may want to lower frequency and intensity of the program      Your healthcare team would like you to participate in a walking program prior to your upcoming abdominal surgery.   ? This will help to increase your endurance and strength before undergoing surgery.   ? It is recommended that you make time to walk 5 days out of very week.     Advanced Program  Week 1: Begin with a 30-minute walk   5 minute warm up, 20 minute walk, 5 minute cool down   Walk at a brisk or vigorous speed if able  Add an additional 10 or more minutes every week as able to tolerate   Week 2: 40 minute walk   5 minute warm up, 30 minute walk, 5 minute cool down  Week 3: 50 minute walk   5 minute warm up, 40 minute walk, 5 minute cool down  Week 4: 60 minute walk    5 minute warm up, 40 minute walk, 5 minute cool down  You may break you walks up multiple times throughout the day if needed.   Remember to always rest as necessary you should discontinue walking if you experience increased pain or dizziness.  Target is 14-17 RPE (rate of perceived exertion)      Where to walk: You can start with your neighborhood, a park, track, mall, living room, or gym.       Abdominal Surgery Prehab Exercises: Lower Extremity Strengthening  Complete each exercise 5 times, then increase your repetitions as tolerated.   SIT TO STAND     1. Start seated in a chair.  2. Without using your arms, slowly rise to full standing position.    ?  You can use your arms if you are unable to complete this exercise without them.  3. Slowly return to sitting.  SIDE KICK     ? Hold onto table, counter or sturdy chair for balance.  ? Keep knee straight, toes pointed forward, move one leg outward.  ? Return to start position.  ? Repeat with other leg.  BRIDGING     ? Lie on your back with your knees bent and feet on the bed.  ? Squeeze your buttocks muscles and slowly lift your hips off the bed to create a ?bridge? with your body.  ? Hold for 3 seconds and then lower yourself back to bed.  **Please refer to the ?Getting into and out of bed after abdominal surgery? handout for further education.      Abdominal Surgery Prehab Exercises: Bed Level Core  Strengthening   Complete each exercise 5 times, then increase your repetitions as tolerated.  BRIDGING     ? Lie on your back  with your knees bent and feet on the bed.  ? Squeeze your buttocks muscles and slowly lift your hips off the bed to create a ?bridge? with your body.  ? Hold for 3 seconds and then lower yourself back to bed.   SUPINE MARCHING     ? Lie on your back with your knees bent.  ? Use your stomach muscles to keep your back from moving.  ? Alternate slowly lifting your right foot and then your left foot.   CLAMSHELL      ? Lie on your side with your hips and knees slightly bent.  ? While keeping your heels together, rotate your top knee up towards the ceiling.  ? Let your leg do the movement, do not let your hip roll back.     **Please refer to the ?Getting into and out of bed after abdominal surgery? handout for further education.      Back Safety: Getting Into and Out of Bed  Safety Tip: After you stand up, wait a moment before walking to be sure you?re not dizzy.   Good posture protects your back when you sit, stand, and walk. It is also important while getting into and out of bed. Follow the steps below to get out of bed. Reverse them to get into bed.     Roll Onto Your Side   1. Roll Onto Your Side  ? Bend your knees up and keep them together.  ? Reach your opposite arm across your body towards the direction of the roll.  ? Roll your shoulders and hips at the same time, like a log, to avoid twisting.       Raise Your Body   2. Raise Your Body  ? Place hands on the bed in front of you.  ? Bring your legs off the edge of the bed, as you push your upper body up with your arms.  ? Allow the weight of your legs to help you move.       Stand Up   3. Stand Up  ? Lean forward from your hip and roll onto the balls of your feet.  ? Flatten your stomach muscles to keep your back from arching.  ? Using your arm and leg muscles, push yourself to a standing position.      If you have questions, please call [phone]. Ask to be connected to Einar Pheasant (Nurse for Dr. Daphine Deutscher).    If your needs are URGENT and it?s after hours   please call [phone] and have provider on call paged.

## 2019-11-26 ENCOUNTER — Encounter: Admit: 2019-11-26 | Discharge: 2019-11-26 | Payer: PRIVATE HEALTH INSURANCE

## 2019-11-26 ENCOUNTER — Ambulatory Visit: Admit: 2019-11-26 | Discharge: 2019-11-26 | Payer: Commercial Managed Care - HMO

## 2019-11-26 DIAGNOSIS — M359 Systemic involvement of connective tissue, unspecified: Secondary | ICD-10-CM

## 2019-11-26 DIAGNOSIS — M1990 Unspecified osteoarthritis, unspecified site: Secondary | ICD-10-CM

## 2019-11-26 DIAGNOSIS — T8859XA Other complications of anesthesia, initial encounter: Secondary | ICD-10-CM

## 2019-11-26 DIAGNOSIS — R159 Full incontinence of feces: Secondary | ICD-10-CM

## 2019-11-26 DIAGNOSIS — R3989 Other symptoms and signs involving the genitourinary system: Principal | ICD-10-CM

## 2019-11-26 DIAGNOSIS — B999 Unspecified infectious disease: Secondary | ICD-10-CM

## 2019-11-26 NOTE — Patient Instructions
We are transitioning to new billing practices; if you have any questions about your bill please call Patient Financial Services phone line: [phone] or [phone], Monday-Friday, 8:30a.m. - 4:30 p.m.      Please send Mychart message or call Helmut Muster, RN Dr. Milus Banister nurse at [phone] if you have any questions or concerns.    General Instructions:  ? To have a medication refilled:  Please use the MyChart Refill request or contact your pharmacy directly to request medication refills.  Please allow 72 hours.   ? Medical Office Building Lab is on the 1st floor. It is open from 7 am-6pm  Tuesday-Friday and 6:30am-7pm on Mondays, and 7 am - Noon on Saturdays    [facility] is located on the 2nd floor and is open 8 am-5 pm Monday-Friday   [facility] location lab is located next to the check out desk and is open from 8 AM to  4:45 PM Monday through Friday.   ? Radiology is on the 2nd floor of the Medical Office Building and the 2nd Floor of [facility]. Radiology Scheduling can be reached at [phone]  ? To Schedule office visits:  Call [phone] select option 1.    ? For procedure scheduling questions at the [facility] or [place] please call [phone]; for a procedure at [facility] please call [phone].  ? To receive appointment reminders on your cell phone: Make sure we have your cell phone number, and Text [place] to [phone].  ? Support for many chronic illnesses is available through [place]: [email or web address][employer]no or [phone].  ? For urgent questions on nights, weekends or holidays, call the Operator at [phone], and ask for the doctor on call for Gastroenterology. Call 911 for any emergencies.

## 2019-11-27 ENCOUNTER — Encounter: Admit: 2019-11-27 | Discharge: 2019-11-27 | Payer: PRIVATE HEALTH INSURANCE

## 2019-11-27 DIAGNOSIS — M359 Systemic involvement of connective tissue, unspecified: Secondary | ICD-10-CM

## 2019-11-27 DIAGNOSIS — M1990 Unspecified osteoarthritis, unspecified site: Secondary | ICD-10-CM

## 2019-11-27 DIAGNOSIS — B999 Unspecified infectious disease: Secondary | ICD-10-CM

## 2019-11-27 DIAGNOSIS — T8859XA Other complications of anesthesia, initial encounter: Secondary | ICD-10-CM

## 2019-12-05 ENCOUNTER — Encounter: Admit: 2019-12-05 | Discharge: 2019-12-05 | Payer: PRIVATE HEALTH INSURANCE

## 2019-12-05 DIAGNOSIS — N3010 Interstitial cystitis (chronic) without hematuria: Secondary | ICD-10-CM

## 2019-12-05 MED ORDER — AMITRIPTYLINE 10 MG PO TAB
ORAL_TABLET | Freq: Every evening | 0 refills | Status: AC
Start: 2019-12-05 — End: ?

## 2019-12-14 ENCOUNTER — Encounter: Admit: 2019-12-14 | Discharge: 2019-12-14 | Payer: PRIVATE HEALTH INSURANCE

## 2019-12-15 ENCOUNTER — Encounter: Admit: 2019-12-15 | Discharge: 2019-12-15 | Payer: PRIVATE HEALTH INSURANCE

## 2019-12-15 ENCOUNTER — Inpatient Hospital Stay: Admit: 2019-12-15 | Discharge: 2019-12-15 | Payer: PRIVATE HEALTH INSURANCE

## 2019-12-15 DIAGNOSIS — M1990 Unspecified osteoarthritis, unspecified site: Secondary | ICD-10-CM

## 2019-12-15 DIAGNOSIS — T8859XA Other complications of anesthesia, initial encounter: Secondary | ICD-10-CM

## 2019-12-15 DIAGNOSIS — M359 Systemic involvement of connective tissue, unspecified: Secondary | ICD-10-CM

## 2019-12-15 DIAGNOSIS — B999 Unspecified infectious disease: Secondary | ICD-10-CM

## 2019-12-15 MED ORDER — CEFAZOLIN INJ 1GM IVP
2 g | INTRAVENOUS | 0 refills | Status: CP
Start: 2019-12-15 — End: ?
  Administered 2019-12-15 – 2019-12-16 (×3): 2 g via INTRAVENOUS

## 2019-12-15 MED ORDER — LIDOCAINE-EPINEPHRINE 1 %-1:100,000 IJ SOLN
0 refills | Status: DC
Start: 2019-12-15 — End: 2019-12-15
  Administered 2019-12-15: 16:00:00 5 mL via SUBCUTANEOUS

## 2019-12-15 MED ORDER — ACETAMINOPHEN 500 MG PO TAB
1000 mg | Freq: Once | ORAL | 0 refills | Status: CP
Start: 2019-12-15 — End: ?
  Administered 2019-12-15: 13:00:00 1000 mg via ORAL

## 2019-12-15 MED ORDER — SODIUM CHLORIDE 0.9 % IV SOLP
INTRAVENOUS | 0 refills | Status: DC
Start: 2019-12-15 — End: 2019-12-15
  Administered 2019-12-15: 16:00:00 via INTRAVENOUS

## 2019-12-15 MED ORDER — AMITRIPTYLINE 10 MG PO TAB
20 mg | Freq: Every evening | ORAL | 0 refills | Status: DC
Start: 2019-12-15 — End: 2019-12-17
  Administered 2019-12-17: 02:00:00 20 mg via ORAL

## 2019-12-15 MED ORDER — METRONIDAZOLE IN NACL (ISO-OS) 500 MG/100 ML IV PGBK
500 mg | Freq: Once | INTRAVENOUS | 0 refills | Status: CP
Start: 2019-12-15 — End: ?
  Administered 2019-12-15: 13:00:00 500 mg via INTRAVENOUS

## 2019-12-15 MED ORDER — PATCH DOCUMENTATION - SCOPOLAMINE BASE 1 MG/72HR
1 | Freq: Two times a day (BID) | TRANSDERMAL | 0 refills | Status: DC
Start: 2019-12-15 — End: 2019-12-16

## 2019-12-15 MED ORDER — SODIUM CITRATE-CITRIC ACID 500-334 MG/5 ML PO SOLN
30 mL | Freq: Once | ORAL | 0 refills | Status: CP
Start: 2019-12-15 — End: ?
  Administered 2019-12-15: 15:00:00 30 mL via ORAL

## 2019-12-15 MED ORDER — HYDROMORPHONE (PF) 2 MG/ML IJ SYRG
INTRAVENOUS | 0 refills | Status: DC
Start: 2019-12-15 — End: 2019-12-15
  Administered 2019-12-15 (×4): .5 mg via INTRAVENOUS

## 2019-12-15 MED ORDER — GABAPENTIN 300 MG PO CAP
600 mg | Freq: Once | ORAL | 0 refills | Status: CP
Start: 2019-12-15 — End: ?
  Administered 2019-12-15: 13:00:00 600 mg via ORAL

## 2019-12-15 MED ORDER — PROMETHAZINE 25 MG/ML IJ SOLN
6.25 mg | INTRAVENOUS | 0 refills | Status: DC | PRN
Start: 2019-12-15 — End: 2019-12-15

## 2019-12-15 MED ORDER — DEXMEDETOMIDINE 20 MCG/ 5 ML VIAL (INFUSION)(AM)(OR)
INTRAVENOUS | 0 refills | Status: DC
Start: 2019-12-15 — End: 2019-12-15
  Administered 2019-12-15: 18:00:00 8 ug via INTRAVENOUS

## 2019-12-15 MED ORDER — ONDANSETRON HCL (PF) 4 MG/2 ML IJ SOLN
4 mg | INTRAVENOUS | 0 refills | Status: DC | PRN
Start: 2019-12-15 — End: 2019-12-17

## 2019-12-15 MED ORDER — ACETAMINOPHEN 500 MG PO TAB
1000 mg | Freq: Once | ORAL | 0 refills | Status: DC | PRN
Start: 2019-12-15 — End: 2019-12-15

## 2019-12-15 MED ORDER — DEXAMETHASONE SODIUM PHOSPHATE 4 MG/ML IJ SOLN
INTRAVENOUS | 0 refills | Status: DC
Start: 2019-12-15 — End: 2019-12-15
  Administered 2019-12-15: 16:00:00 4 mg via INTRAVENOUS

## 2019-12-15 MED ORDER — DIPHENHYDRAMINE HCL 50 MG/ML IJ SOLN
12.5 mg | INTRAVENOUS | 0 refills | Status: DC | PRN
Start: 2019-12-15 — End: 2019-12-15

## 2019-12-15 MED ORDER — NALOXEGOL 25 MG PO TAB
25 mg | Freq: Once | ORAL | 0 refills | Status: CP
Start: 2019-12-15 — End: ?
  Administered 2019-12-15: 13:00:00 25 mg via ORAL

## 2019-12-15 MED ORDER — SCOPOLAMINE BASE 1 MG OVER 3 DAYS TD PT3D
1 | Freq: Once | TRANSDERMAL | 0 refills | Status: DC
Start: 2019-12-15 — End: 2019-12-16
  Administered 2019-12-15: 15:00:00 1 via TRANSDERMAL

## 2019-12-15 MED ORDER — AMITRIPTYLINE 10 MG PO TAB
10 mg | Freq: Once | ORAL | 0 refills | Status: CP
Start: 2019-12-15 — End: ?
  Administered 2019-12-16: 04:00:00 10 mg via ORAL

## 2019-12-15 MED ORDER — ACETAMINOPHEN 500 MG PO TAB
1000 mg | ORAL | 0 refills | Status: DC
Start: 2019-12-15 — End: 2019-12-17
  Administered 2019-12-15 – 2019-12-17 (×5): 1000 mg via ORAL

## 2019-12-15 MED ORDER — SUGAMMADEX 100 MG/ML IV SOLN
INTRAVENOUS | 0 refills | Status: DC
Start: 2019-12-15 — End: 2019-12-15
  Administered 2019-12-15: 19:00:00 160 mg via INTRAVENOUS

## 2019-12-15 MED ORDER — SCOPOLAMINE BASE 1 MG OVER 3 DAYS TD PT3D
1 | TRANSDERMAL | 0 refills | Status: DC
Start: 2019-12-15 — End: 2019-12-16

## 2019-12-15 MED ORDER — FENTANYL CITRATE (PF) 50 MCG/ML IJ SOLN
INTRAVENOUS | 0 refills | Status: DC
Start: 2019-12-15 — End: 2019-12-15
  Administered 2019-12-15 (×2): 50 ug via INTRAVENOUS

## 2019-12-15 MED ORDER — CELECOXIB 100 MG PO CAP
200 mg | Freq: Two times a day (BID) | ORAL | 0 refills | Status: DC
Start: 2019-12-15 — End: 2019-12-17
  Administered 2019-12-16 – 2019-12-17 (×4): 200 mg via ORAL

## 2019-12-15 MED ORDER — MELATONIN 3 MG PO TAB
3 mg | Freq: Every evening | ORAL | 0 refills | Status: DC
Start: 2019-12-15 — End: 2019-12-16
  Administered 2019-12-16: 01:00:00 3 mg via ORAL

## 2019-12-15 MED ORDER — FAMOTIDINE (PF) 20 MG/2 ML IV SOLN
20 mg | Freq: Once | INTRAVENOUS | 0 refills | Status: CP
Start: 2019-12-15 — End: ?
  Administered 2019-12-15: 15:00:00 20 mg via INTRAVENOUS

## 2019-12-15 MED ORDER — MEPERIDINE (PF) 25 MG/ML IJ SYRG
12.5 mg | INTRAVENOUS | 0 refills | Status: DC | PRN
Start: 2019-12-15 — End: 2019-12-15

## 2019-12-15 MED ORDER — TRAMADOL 50 MG PO TAB
50 mg | ORAL | 0 refills | Status: DC | PRN
Start: 2019-12-15 — End: 2019-12-17
  Administered 2019-12-16 – 2019-12-17 (×4): 50 mg via ORAL

## 2019-12-15 MED ORDER — LIDOCAINE (PF) 20 MG/ML (2 %) IJ SOLN
INTRAVENOUS | 0 refills | Status: DC
Start: 2019-12-15 — End: 2019-12-15
  Administered 2019-12-15: 16:00:00 100 mg via INTRAVENOUS

## 2019-12-15 MED ORDER — DIAZEPAM 5 MG/ML IJ SYRG
2.5 mg | Freq: Once | INTRAVENOUS | 0 refills | Status: DC | PRN
Start: 2019-12-15 — End: 2019-12-15

## 2019-12-15 MED ORDER — ROCURONIUM 10 MG/ML IV SOLN
INTRAVENOUS | 0 refills | Status: DC
Start: 2019-12-15 — End: 2019-12-15
  Administered 2019-12-15: 16:00:00 20 mg via INTRAVENOUS
  Administered 2019-12-15: 16:00:00 50 mg via INTRAVENOUS
  Administered 2019-12-15: 18:00:00 20 mg via INTRAVENOUS

## 2019-12-15 MED ORDER — KETAMINE 10 MG/ML IJ SOLN
INTRAVENOUS | 0 refills | Status: DC
Start: 2019-12-15 — End: 2019-12-15
  Administered 2019-12-15: 16:00:00 20 mg via INTRAVENOUS
  Administered 2019-12-15: 16:00:00 10 mg via INTRAVENOUS

## 2019-12-15 MED ORDER — PROPOFOL 10 MG/ML IV EMUL 100 ML (INFUSION)(AM)(OR)
INTRAVENOUS | 0 refills | Status: DC
Start: 2019-12-15 — End: 2019-12-15
  Administered 2019-12-15: 17:00:00 180 ug/kg/min via INTRAVENOUS
  Administered 2019-12-15: 16:00:00 140 ug/kg/min via INTRAVENOUS

## 2019-12-15 MED ORDER — HYDRALAZINE 20 MG/ML IJ SOLN
5 mg | INTRAVENOUS | 0 refills | Status: DC | PRN
Start: 2019-12-15 — End: 2019-12-15

## 2019-12-15 MED ORDER — CEFAZOLIN IN 0.9% SOD CHLORIDE 2 GRAM/110 ML IVPB
2 g | Freq: Once | INTRAVENOUS | 0 refills | Status: DC
Start: 2019-12-15 — End: 2019-12-15

## 2019-12-15 MED ORDER — AMITRIPTYLINE 10 MG PO TAB
10 mg | Freq: Every evening | ORAL | 0 refills | Status: DC
Start: 2019-12-15 — End: 2019-12-16
  Administered 2019-12-16: 01:00:00 10 mg via ORAL

## 2019-12-15 MED ORDER — TRIMETHOBENZAMIDE 100 MG/ML IM SOLN
200 mg | Freq: Once | INTRAMUSCULAR | 0 refills | Status: DC | PRN
Start: 2019-12-15 — End: 2019-12-15

## 2019-12-15 MED ORDER — HYDROMORPHONE (PF) 2 MG/ML IJ SYRG
1 mg | INTRAVENOUS | 0 refills | Status: DC | PRN
Start: 2019-12-15 — End: 2019-12-15

## 2019-12-15 MED ORDER — PROPOFOL INJ 10 MG/ML IV VIAL
INTRAVENOUS | 0 refills | Status: DC
Start: 2019-12-15 — End: 2019-12-15
  Administered 2019-12-15: 16:00:00 150 mg via INTRAVENOUS

## 2019-12-15 MED ORDER — ONDANSETRON HCL (PF) 4 MG/2 ML IJ SOLN
4 mg | Freq: Once | INTRAVENOUS | 0 refills | Status: DC | PRN
Start: 2019-12-15 — End: 2019-12-15

## 2019-12-15 MED ORDER — CELECOXIB 200 MG PO CAP
200 mg | Freq: Once | ORAL | 0 refills | Status: CP
Start: 2019-12-15 — End: ?
  Administered 2019-12-15: 13:00:00 200 mg via ORAL

## 2019-12-15 MED ORDER — AMITRIPTYLINE 10 MG PO TAB
20 mg | Freq: Every evening | ORAL | 0 refills | Status: DC
Start: 2019-12-15 — End: 2019-12-16

## 2019-12-15 MED ORDER — FENTANYL CITRATE (PF) 50 MCG/ML IJ SOLN
25 ug | INTRAVENOUS | 0 refills | Status: DC | PRN
Start: 2019-12-15 — End: 2019-12-15

## 2019-12-15 MED ORDER — MELATONIN 5 MG PO TAB
5 mg | Freq: Every evening | ORAL | 0 refills | Status: DC
Start: 2019-12-15 — End: 2019-12-16

## 2019-12-15 MED ORDER — LACTATED RINGERS IV SOLP
500 mL | INTRAVENOUS | 0 refills | Status: DC
Start: 2019-12-15 — End: 2019-12-15

## 2019-12-15 MED ORDER — HYDROMORPHONE (PF) 2 MG/ML IJ SYRG
.5 mg | INTRAVENOUS | 0 refills | Status: DC | PRN
Start: 2019-12-15 — End: 2019-12-15

## 2019-12-15 MED ORDER — LIDOCAINE (PF) 10 MG/ML (1 %) IJ SOLN
.2 mL | INTRAMUSCULAR | 0 refills | Status: DC | PRN
Start: 2019-12-15 — End: 2019-12-15

## 2019-12-15 MED ORDER — SODIUM CHLORIDE 0.9 % IV SOLP
250 mL | INTRAVENOUS | 0 refills | Status: DC
Start: 2019-12-15 — End: 2019-12-16
  Administered 2019-12-15: 13:00:00 250 mL via INTRAVENOUS
  Administered 2019-12-15: 19:00:00 250.000 mL via INTRAVENOUS

## 2019-12-15 MED ORDER — ENOXAPARIN 40 MG/0.4 ML SC SYRG
40 mg | Freq: Every day | SUBCUTANEOUS | 0 refills | Status: DC
Start: 2019-12-15 — End: 2019-12-17
  Administered 2019-12-16 – 2019-12-17 (×2): 40 mg via SUBCUTANEOUS

## 2019-12-15 MED ORDER — NALOXEGOL 25 MG PO TAB
25 mg | Freq: Every day | ORAL | 0 refills | Status: DC
Start: 2019-12-15 — End: 2019-12-17
  Administered 2019-12-16 – 2019-12-17 (×2): 25 mg via ORAL

## 2019-12-15 MED ORDER — MIDAZOLAM 1 MG/ML IJ SOLN
INTRAVENOUS | 0 refills | Status: DC
Start: 2019-12-15 — End: 2019-12-15
  Administered 2019-12-15: 16:00:00 2 mg via INTRAVENOUS

## 2019-12-15 MED ORDER — OXYCODONE 5 MG PO TAB
5-10 mg | Freq: Once | ORAL | 0 refills | Status: DC | PRN
Start: 2019-12-15 — End: 2019-12-15

## 2019-12-15 MED ORDER — LACTATED RINGERS IV SOLP
INTRAVENOUS | 0 refills | Status: AC
Start: 2019-12-15 — End: ?
  Administered 2019-12-15 – 2019-12-16 (×4): 1000.000 mL via INTRAVENOUS

## 2019-12-15 MED ORDER — GABAPENTIN 300 MG PO CAP
300 mg | Freq: Three times a day (TID) | ORAL | 0 refills | Status: DC
Start: 2019-12-15 — End: 2019-12-17
  Administered 2019-12-15 – 2019-12-17 (×5): 300 mg via ORAL

## 2019-12-15 MED ORDER — ONDANSETRON HCL (PF) 4 MG/2 ML IJ SOLN
INTRAVENOUS | 0 refills | Status: DC
Start: 2019-12-15 — End: 2019-12-15
  Administered 2019-12-15: 18:00:00 4 mg via INTRAVENOUS

## 2019-12-15 MED ORDER — HALOPERIDOL LACTATE 5 MG/ML IJ SOLN
1 mg | Freq: Once | INTRAVENOUS | 0 refills | Status: DC | PRN
Start: 2019-12-15 — End: 2019-12-15

## 2019-12-15 MED ORDER — BUPIVACAINE HCL 0.25 % (2.5 MG/ML) IJ SOLN
0 refills | Status: DC
Start: 2019-12-15 — End: 2019-12-15
  Administered 2019-12-15: 16:00:00 5 mL via SUBCUTANEOUS

## 2019-12-15 MED ORDER — DEXTROSE 5%-0.45% SODIUM CHLORIDE & POTASSIUM CHLORIDE 20 MEQ/L IV SOLP
INTRAVENOUS | 0 refills | Status: DC
Start: 2019-12-15 — End: 2019-12-17
  Administered 2019-12-16: 20:00:00 1000.000 mL via INTRAVENOUS

## 2019-12-15 MED ORDER — LABETALOL 5 MG/ML IV SYRG
5 mg | INTRAVENOUS | 0 refills | Status: DC | PRN
Start: 2019-12-15 — End: 2019-12-15

## 2019-12-15 MED ORDER — METRONIDAZOLE IN NACL (ISO-OS) 500 MG/100 ML IV PGBK
500 mg | INTRAVENOUS | 0 refills | Status: CP
Start: 2019-12-15 — End: ?
  Administered 2019-12-15 – 2019-12-16 (×2): 500 mg via INTRAVENOUS

## 2019-12-15 MED ORDER — CEFAZOLIN 1 GRAM IJ SOLR
INTRAVENOUS | 0 refills | Status: DC
Start: 2019-12-15 — End: 2019-12-15
  Administered 2019-12-15: 16:00:00 2 g via INTRAVENOUS

## 2019-12-15 MED ORDER — DULOXETINE 60 MG PO CPDR
120 mg | Freq: Every evening | ORAL | 0 refills | Status: DC
Start: 2019-12-15 — End: 2019-12-17
  Administered 2019-12-16 – 2019-12-17 (×2): 120 mg via ORAL

## 2019-12-15 MED ORDER — ARTIFICIAL TEARS SINGLE DOSE DROPS GROUP
OPHTHALMIC | 0 refills | Status: DC
Start: 2019-12-15 — End: 2019-12-15
  Administered 2019-12-15: 16:00:00 2 [drp] via OPHTHALMIC

## 2019-12-15 MED ORDER — PHENOL 1.4 % MM SPRA
2 | OROMUCOSAL | 0 refills | Status: DC | PRN
Start: 2019-12-15 — End: 2019-12-17

## 2019-12-16 MED ORDER — MELATONIN 5 MG PO TAB
30 mg | Freq: Every evening | ORAL | 0 refills | Status: DC
Start: 2019-12-16 — End: 2019-12-17
  Administered 2019-12-17: 02:00:00 30 mg via ORAL

## 2019-12-16 MED ADMIN — WATER FOR INJECTION, STERILE IJ SOLN [79513]: 20 mL | INTRAVENOUS | @ 07:00:00 | Stop: 2019-12-16 | NDC 63323018520

## 2019-12-17 ENCOUNTER — Encounter: Admit: 2019-12-17 | Discharge: 2019-12-17 | Payer: PRIVATE HEALTH INSURANCE

## 2019-12-17 DIAGNOSIS — M1990 Unspecified osteoarthritis, unspecified site: Secondary | ICD-10-CM

## 2019-12-17 DIAGNOSIS — T8859XA Other complications of anesthesia, initial encounter: Secondary | ICD-10-CM

## 2019-12-17 DIAGNOSIS — M359 Systemic involvement of connective tissue, unspecified: Secondary | ICD-10-CM

## 2019-12-17 DIAGNOSIS — B999 Unspecified infectious disease: Secondary | ICD-10-CM

## 2019-12-17 MED ORDER — TRAMADOL 50 MG PO TAB
50 mg | ORAL_TABLET | Freq: Every day | ORAL | 0 refills | Status: AC
Start: 2019-12-17 — End: ?

## 2019-12-17 MED ORDER — ACETAMINOPHEN 325 MG PO TAB
1000 mg | ORAL_TABLET | ORAL | 0 refills | Status: AC | PRN
Start: 2019-12-17 — End: ?

## 2019-12-19 ENCOUNTER — Encounter: Admit: 2019-12-19 | Discharge: 2019-12-19 | Payer: PRIVATE HEALTH INSURANCE

## 2019-12-19 NOTE — Telephone Encounter
Amy, HH RN called and reports she went out and saw Janice Gregory for home health.     Amy reports that Janice Gregory is having issues with pain control. She is not currently taking any OTC medications, but is requesting to take Tramadol BID.     This RN advised we would prefer not to increase Tramadol to BID, but rather we want to begin otc medications. RN educated on the following information. Amy HH RN v/u and agrees with plan. She will relay this information to the patient. This RN did advise that Janice Gregory is welcome to call us if further concerns/questions/issues. She v/u and will relay this forward. NO further needs.    Pain Medication Needs:  Please use over the counter medications to assist with managing pain. Begin with a 1000mg  dose of Tylenol and wait 6 hours. After these 6 hours, take a 800mg  dose of Ibuprofen and again wait 6 hours. Continue alternating between Ibuprofen and Tylenol as educated above around the clock. Be warned not to exceed 4000mg  of Tylenol and not to exceed 3200mg  of Ibuprofen in a 24 hour period.     **If you have been previously advised against the use of Tylenol or Ibuprofen due to other medical condition, do not take. If you are able to take one of these and not the other, than please take a new dose of the over the counter medication every 6 hours to help with pain management.     The prescription pain medication (such as, but not limited to: Oxycodone, Norco, Tramadol, etc), provided to you by your physician, should only be taken along side with the Tylenol and Ibuprofen when your  pain is not controlled by the these over the counter medications alone.  Be aware that in these first few weeks, your pain will likely not be a 0/10, but to find an acceptable pain tolerance level and to make this your goal for pain control. We would like to wean youn away from the use of prescription pain medication to the point that the only medications needed for pain are the over the counter Tylenol and Ibuprofen.    **Prescription pain medications can be constipating and we want to ensure to prevent any complications related to this. We would also like to recommend, if you are not already doing so, to take a daily bowel regimen to help prevent constipation (see below).

## 2019-12-30 ENCOUNTER — Encounter: Admit: 2019-12-30 | Discharge: 2019-12-30 | Payer: PRIVATE HEALTH INSURANCE

## 2019-12-31 ENCOUNTER — Encounter: Admit: 2019-12-31 | Discharge: 2019-12-31 | Payer: PRIVATE HEALTH INSURANCE

## 2019-12-31 DIAGNOSIS — N3010 Interstitial cystitis (chronic) without hematuria: Secondary | ICD-10-CM

## 2019-12-31 MED ORDER — AMITRIPTYLINE 10 MG PO TAB
ORAL_TABLET | Freq: Every evening | 0 refills
Start: 2019-12-31 — End: ?

## 2020-01-01 NOTE — Progress Notes
..  This prechart is intended to be a reference for patient appointments. Information is gathered from in chart as well as external records review. Information will be clarified/verified/updated in final documentation in the office visit.     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  She was having significant fecal incontinence on a daily basis that significantly affected her quality of life. 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    Problem List:  Fecal Incontinence     12/15/2019 - 1) Laparoscopic lysis of adhesions 2) Primary repair of small bowel serosal injuries 3) End descending colostomy  Findings:  Dense small bowel adhesions to prior midline incision - mesh?, lysis of adhesions >60 minutes, serosal injuries primarily repaired, well perfused end descending colostomy

## 2020-01-06 ENCOUNTER — Encounter: Admit: 2020-01-06 | Discharge: 2020-01-06 | Payer: PRIVATE HEALTH INSURANCE

## 2020-01-06 ENCOUNTER — Encounter: Admit: 2020-01-06 | Discharge: 2020-01-06 | Payer: Commercial Managed Care - HMO

## 2020-01-06 DIAGNOSIS — B999 Unspecified infectious disease: Secondary | ICD-10-CM

## 2020-01-06 DIAGNOSIS — R42 Dizziness and giddiness: Secondary | ICD-10-CM

## 2020-01-06 DIAGNOSIS — R55 Syncope and collapse: Secondary | ICD-10-CM

## 2020-01-06 DIAGNOSIS — T8859XA Other complications of anesthesia, initial encounter: Secondary | ICD-10-CM

## 2020-01-06 DIAGNOSIS — R159 Full incontinence of feces: Secondary | ICD-10-CM

## 2020-01-06 DIAGNOSIS — M1990 Unspecified osteoarthritis, unspecified site: Secondary | ICD-10-CM

## 2020-01-06 DIAGNOSIS — M359 Systemic involvement of connective tissue, unspecified: Secondary | ICD-10-CM

## 2020-01-06 LAB — TSH WITH FREE T4 REFLEX: Lab: 2.2 uU/mL (ref 0.35–5.00)

## 2020-01-06 LAB — CBC
Lab: 11 % (ref 11–15)
Lab: 14 g/dL (ref 12.0–15.0)
Lab: 234 10*3/uL (ref 150–400)
Lab: 32 pg (ref 26–34)
Lab: 4.5 M/UL (ref 4.0–5.0)
Lab: 43 % — ABNORMAL HIGH (ref 36–45)
Lab: 6.3 K/UL (ref 4.5–11.0)
Lab: 7.7 FL (ref 7–11)
Lab: 95 FL (ref 80–100)

## 2020-01-06 LAB — COMPREHENSIVE METABOLIC PANEL
Lab: 136 MMOL/L — ABNORMAL LOW (ref 137–147)
Lab: 23 U/L (ref 7–40)
Lab: 23 U/L (ref 7–56)
Lab: 33 MMOL/L — ABNORMAL HIGH (ref 21–30)
Lab: 4 (ref 3–12)
Lab: 7.1 g/dL (ref 6.0–8.0)
Lab: >60 mL/min (ref >60)
Lab: >60 mL/min (ref >60)

## 2020-01-06 MED ORDER — TRAMADOL 50 MG PO TAB
50 mg | ORAL_TABLET | Freq: Every day | ORAL | 0 refills | Status: DC
Start: 2020-01-06 — End: 2020-01-06

## 2020-01-06 MED ORDER — TRAMADOL 50 MG PO TAB
50 mg | ORAL_TABLET | Freq: Every day | ORAL | 0 refills | Status: AC
Start: 2020-01-06 — End: ?

## 2020-01-06 NOTE — Progress Notes
TSH is totally normal. All have been faxed to your PCP.     Brunilda Payor. Fredricka Bonine MSN, APRN, AGCNS-BC, AGPCNP-BC  Advanced Practice Provider  Colon and Rectal Surgery  Surgical Oncology  APP for Dr. Kathee Delton, Dr. Daphine Deutscher and Dr. Daiva Nakayama

## 2020-01-06 NOTE — Patient Instructions
.  Thank you for visiting me today. Your time is important and if you had to wait at all today, I apologize. My goal is to run exactly on time; however, on occasion, I get behind in clinic due to unexpected patient issues or room constraints. Please use the nurses line below if you have any questions or concerns at any time. Leave a message if necessary and your call will be returned. Feel free to send us a MyChart message as well. Messages are reviewed Monday through Friday 8am to 4pm. If it is after 4pm, it may be returned the following business day. Our office is closed on weekends and major holidays. If you have an urgent need after hours please call [phone] and ask for the surgical oncology resident on call to be paged.     If testing was ordered today please know it will automatically be released to your mychart. You may review this before I do. I will send you a message with this result once I have reviewed this.     Please call my nurse listed below for questions or concerns.  Kiersten Galemore (Dr. Martin and Maisha Bogen APRN) - ph. [phone] fax. [phone]     Zayneb Baucum M. Jacqulynn Shappell MSN, APRN, AGCNS-BC, AGPCNP-BC  Advanced Practice Provider  Colon and Rectal Surgery  Surgical Oncology  APP for Dr. Ashcraft, Dr. Martin, and Dr. Luka

## 2020-01-06 NOTE — Progress Notes
Name: Janice Gregory          MRN: [id number]      DOB: 10/01/1960      AGE: 59 y.o.   DATE OF SERVICE: 01/06/2020    Subjective:             Reason for Visit:  Post Operative Visit      ESM… FLAMMER is a 59 y.o. female.     Cancer Staging  No matching staging information was found for the patient.    History of Present Illness  Janice Gregory is a 59 year-old female with history of sicca syndrome, possible Sjogren's, chronic constipation, fibromyalgia, and multiple pelvic surgeries: traumatic SVD '81 requiring primary repair, ex-lap/appendectomy after hysterectomy '97, A/P vaginal repair '00, rectocele repair/rectopexy/sphincter repair/transvaginal sling '04 for rectal prolapse, excisional hemorrhoidectomy '09, Delorme procedure 07/10/16 for recurrent prolapse, and excisional hemorrhoidectomy again 08/07/16.  She was having significant fecal incontinence on a daily basis that significantly affected her quality of life. 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    She returns today post operatively. The ostomy is functioning well. She has not had problems with skin breakdown or leaking. She is emptying the ostomy three times a day. It is thick output. She is having abdominal pain. Requests a tramadol refill. She has been using ibuprofen and tylenol. The biggest problem has been fainting after surgery. She has had this happen several times. She is eating and drinking well. Not on any blood pressure medication. Only medication change has been increased amitriptyline.  However the fainting was occurring before the increased amitriptyline.  She is worried about her thyroid as her mother had thyroid problems.  She has an appointment with her primary care provider tomorrow.  She has no fever or chills but has been having hot flashes.    12/15/2019 - 1) Laparoscopic lysis of adhesions 2) Primary repair of small bowel serosal injuries 3) End descending colostomy  Findings:  Dense small bowel adhesions to prior midline incision - mesh?, lysis of adhesions >60 minutes, serosal injuries primarily repaired, well perfused end descending colostomy    Medical History:   Diagnosis Date   ? Arthritis    ? Complication of anesthesia    ? Connective tissue disease (HCC)    ? Infection      Surgical History:   Procedure Laterality Date   ? ANORECTAL MANOMETRY N/A 11/11/2019    Performed by Eliott Nine, MD at [facility]   ? LAPAROSCOPIC COLOSTOMY N/A 12/15/2019    Performed by Benetta Spar, MD at [facility] OR   ? ARTHROPLASTY     ? FOOT SURGERY     ? HERNIA REPAIR     ? HX APPENDECTOMY     ? HX CHOLECYSTECTOMY     ? HX HEMORRHOIDECTOMY     ? HX HYSTERECTOMY     ? HX JOINT REPLACEMENT     ? HYSTERECTOMY     ? KNEE SURGERY       Family History   Problem Relation Age of Onset   ? Osteoporosis Mother    ? Arthritis Mother    ? Heart Attack Father    ? Osteoporosis Sibling    ? Arthritis Sibling    ? Scoliosis Sibling      Social History     Socioeconomic History   ? Marital status: Married     Spouse name: Not on file   ? Number of children: Not on file   ?  Years of education: Not on file   ? Highest education level: Not on file   Occupational History   ? Not on file   Tobacco Use   ? Smoking status: Former Smoker     Quit date: 05/08/1977     Years since quitting: 42.6   ? Smokeless tobacco: Never Used   Vaping Use   ? Vaping Use: Never used   Substance and Sexual Activity   ? Alcohol use: Never   ? Drug use: Never   ? Sexual activity: Not Currently   Other Topics Concern   ? Not on file   Social History Narrative   ? Not on file     Vaping/E-liquid Use   ? Vaping Use Never User                  Review of Systems   Constitutional: Positive for diaphoresis and fatigue. Negative for chills, fever and unexpected weight change.   HENT: Negative.    Eyes: Negative.    Respiratory: Negative.    Cardiovascular: Negative.    Gastrointestinal: Negative.  Negative for abdominal distention, abdominal pain, anal bleeding, blood in stool, constipation, diarrhea, nausea, rectal pain and vomiting.   Endocrine: Negative.    Genitourinary: Negative.    Musculoskeletal: Negative.    Skin: Negative.  Negative for color change and wound.   Allergic/Immunologic: Negative.    Neurological: Positive for light-headedness and headaches. Negative for weakness.   Hematological: Negative.    Psychiatric/Behavioral: Negative.    All other systems reviewed and are negative.    Objective:         ? acetaminophen (TYLENOL) 325 mg tablet Take three tablets by mouth every 8 hours as needed for Pain.   ? acetaminophen (TYLENOL) 325 mg tablet Take 650 mg by mouth every 6 hours.   ? amitriptyline (ELAVIL) 50 mg tablet Take one tablet by mouth at bedtime daily.   ? ascorbic acid (VITAMIN C PO) Take  by mouth.   ? ascorbic acid (VITAMIN C) 1,000 mg tablet Take 2 tablets by mouth daily.   ? Black Cohosh 540 mg cap Take 2 capsules by mouth at bedtime daily.   ? COLLAGEN MISC Take 2 capsules by mouth daily.   ? Cranberry Extract 500 mg cap Take 1 capsule by mouth daily.   ? duloxetine DR (CYMBALTA) 60 mg capsule Take 60 mg by mouth at bedtime daily.   ? estradioL (ESTRACE) 0.01 % (0.1 mg/g) vaginal cream For 14 days, insert/apply 0.5 g (1/2 fingertip unit) intravaginally and to vaginal opening once nightly. Then, apply 1 g (1 fingertip unit) in the same way twice weekly at bedtime.   ? ibuprofen (ADVIL) 200 mg tablet Take 200 mg by mouth every 6 hours. Take with food.   ? Lactobac no.41/Bifidobact no.7 (PROBIOTIC-10 PO) Take 1 capsule by mouth daily. 20 Billion    ? lifitegrast (XIIDRA) 5 % ophthalmic drop Apply 1 drop to both eyes every 12 hours.   ? melatonin 10 mg cap Take 30 mg by mouth at bedtime daily.   ? mv-mn-folic ac-vit K-herb 289 (ALIVE ONCE DAILY WOMEN 50 PLUS) 800-100 mcg tab Take  by mouth.   ? ondansetron (ZOFRAN) 4 mg tablet Take 4 mg by mouth once.   ? pilocarpine (SALAGEN (PILOCARPINE)) 5 mg tablet Take 5 mg by mouth four times daily.   ? polyethylene glycol 3350 (MIRALAX) 17 g packet Take 17 g by mouth daily.   ? safflower oil/linoleic  acid,co (CLA PO) Take 1,300 mg by mouth twice daily.   ? sodium chloride (RETAINE NACL) 5 % ophthalmic solution Place 1 drop into or around eye(s) every 3 hours.   ? traMADoL (ULTRAM) 50 mg tablet Take one tablet by mouth daily.     Vitals:    01/06/20 1007   BP: 131/82   BP Source: Arm, Left Upper   Patient Position: Sitting   Pulse: 78   Resp: 16   Temp: 36.4 ?C (97.6 ?F)   SpO2: 99%   Weight: 75.9 kg (167 lb 6.4 oz)   Height: 175.3 cm (69)   PainSc: Zero     Body mass index is 24.72 kg/m?Marland Kitchen     Pain Score: Zero       Fatigue Scale: 2       Physical Exam  Vitals reviewed.   Constitutional:       General: She is not in acute distress.     Appearance: Normal appearance. She is well-developed.   HENT:      Head: Normocephalic and atraumatic.      Nose: Nose normal.   Eyes:      General: Lids are normal.      Conjunctiva/sclera: Conjunctivae normal.   Pulmonary:      Effort: Pulmonary effort is normal. No respiratory distress.   Genitourinary:     Comments: External: Anus intact without sutures.   Musculoskeletal:         General: Normal range of motion.      Cervical back: Normal range of motion.   Neurological:      Mental Status: She is alert and oriented to person, place, and time.   Psychiatric:         Speech: Speech normal.         Behavior: Behavior normal.         Thought Content: Thought content normal.         Judgment: Judgment normal.               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 Struggling with pain and syncope.     Post-Operative Care:  - Reviewed that the patient has a 10lb weightlifting restriction for 6wks from the date of surgery. I warned the patient of the risk of hernia if lifting too soon. However, encouraged aerobic activity.  - Discussed pain management strategies at this point post operatively. Tylenol, ibuprofen, and a heating pad.   - Tramadol refilled. Will be only medication refill. Watch for correlation between LUQ pain followed by stoma output and resolution of pain.   - Wound care reviewed.  - Bowel habits change reviewed.     Rectal Prolapse:  - Reviewed operative procedure. Discussed anatomy.     Syncope:  - Draw CBC, CMP, and TSH today. Will forward to PCP.   - Highly unlikely to be related to surgery.   - Continue to eat well and push fluids.     Plan:  - RTC 6-8wks with Dr. Daphine Deutscher to ensure resolution of pain and syncope.     .The patient and family were allowed to ask questions and voice concerns; these were addressed to the best of our ability. They expressed understanding of what was explained to them, and they agreed with the present plan. Patient has the phone numbers for the [facility] and was instructed on how to contact us with any questions or concerns.  My collaborating provider on this patient is Dr. Deedra Ehrich MD.    Brunilda Payor. Fredricka Bonine MSN, APRN, AGCNS-BC, AGPCNP-BC  Advanced Practice Provider  Colon and Rectal Surgery  Surgical Oncology  APP for Dr. Kathee Delton, Dr. Daphine Deutscher and Dr. Daiva Nakayama  Pager: [phone]  Available via [employer] and [employer]

## 2020-01-06 NOTE — Progress Notes
Hi Virgin,     The thyroid function is still awaiting to result. All the other results are normal and don't explain the passing out. Will let you know when the thyroid is back.     Thanks,     .Brunilda Payor. Fredricka Bonine MSN, APRN, AGCNS-BC, AGPCNP-BC  Advanced Practice Provider  Colon and Rectal Surgery  Surgical Oncology  APP for Dr. Kathee Delton, Dr. Daphine Deutscher and Dr. Daiva Nakayama

## 2020-01-13 ENCOUNTER — Encounter: Admit: 2020-01-13 | Discharge: 2020-01-13 | Payer: PRIVATE HEALTH INSURANCE

## 2020-01-13 NOTE — Telephone Encounter
[facility] called to confirm Dr. Daphine Deutscher as ordering provider. Rn called back and confirm fax # and ordering provider for her account # [id number] with [facility].

## 2020-01-21 NOTE — Progress Notes
Date of Service: 01/22/2020    Subjective:             Janice Gregory is a 59 y.o. femalei with history of sicca syndrome, possible Sjogren's, lifelong chronic constipation, fibromyalgia, pelvic organ prolapse (see surgical hx below).      History of Present Illness  Since our last visit, she underwent laparoscopic lysis of adhesions, primary repair of small bowel serosal injuries and end descending colostomy with Dr. Daphine Deutscher on 12/15/19.   She also had biopsies of the left breast yesterday.     Pelvic surgery hx: traumatic SVD '81 requiring primary repair, ex-lap/appendectomy after hysterectomy '97, A/P vaginal repair '00, rectocele repair/rectopexy/sphincter repair/transvaginal sling '04 for rectal prolapse, excisional hemorrhoidectomy '09, Delorme procedure 07/10/16 for recurrent prolapse, and excisional hemorrhoidectomy 08/07/16    She presents to follow-up on chronic bladder pain.   Urine culture review:   11/26/19 no growth  09/23/2019 less than 10K mixed contaminants  07/25/2019 no growth; urine micro negative for hematuria  07/17/2019 no growth  07/08/2019 greater than 100 K Pseudomonas aeruginosa, greater than 100 K E. Coli; pan susceptible    She previously completed a course of PFPT with manual fascial release. Due to persistent sx after PT and with negative urine cultures, we started amitriptyline for presumed component of interstitial cystitis. She started amitriptyline about one month ago and reports resolution of bladder pain. Taking 25 mg amitriptyline qhs. (50 mg caused lightheadedness and hypotension).   She also mentions urinary incontinence today. This was discussed at her initial visit in 07/2019. At that visit, she described stress and urge incont, as well as incont in absence of awareness. Today, she reports her predominant type of urinary incontinence is occurring without awareness or with leaning forward to press on [place] (this is what she named her ostomy).   Switched from [employer] brand pads to Poise pads, #2 thickness. Changes pad 2-3x, not soaked every time.   Reports decreased awareness of bladder sensations and her pad is just wet.   She is not using estradiol cream.         Pertinent Review of Systems:  - General ROS: Denies recent weight change above 10 pounds, Denies malaise  - HEENT ROS: Denies blurring of vision. Denies double vision, Denie glaucoma, POSdry eyes  - Cardiovascular ROS: Denies chest pain. Denies palpitations currently. Denie orthopnea  - Respiratory ROS: Denies shortness of breath,  Denies cough,  Denies wheezing currently  - Gastrointestinal ROS: Denies constipation, Denies gastric reflux,  Denies blood in stool, Denies diarrhea, Denies Irritable bowel syndrome, Denies nausea, Denies vomiting, Denies abdominal pain  - Endocrine ROS: Denies polydipsia,  POS excessive sweating. POS hot flashes. Denies Thyroid disease  - Hematological and Lymphatic ROS: Denies easy bruisability , Denies current anemia, Denies adenopathy in the inguinal area  - Neurological ROS: Denie syncope, Denies numbness in lower extremities, Denies neuropathy. Denies shooting pain down the legs, Denies shooting pain in the lower back,POS low back pain  - Musculoskeletal ROS: POS Joint pain. Denies Edema in lower extremities. Denies fibromyalgia  - Psychological ROS: POS depression, POS anxiety, Denies thoughts of suicide, Denies thoughts of homicide, Denies recent seizure, Denies syncope   - Dermatological ROS: Denies eczema. Denies skin rashes in the groin area and other sites      Objective:         ? acetaminophen (TYLENOL) 325 mg tablet Take three tablets by mouth every 8 hours as needed for Pain.   ? acetaminophen (TYLENOL)  325 mg tablet Take 650 mg by mouth every 6 hours.   ? amitriptyline (ELAVIL) 50 mg tablet Take one tablet by mouth at bedtime daily.   ? ascorbic acid (VITAMIN C PO) Take  by mouth.   ? ascorbic acid (VITAMIN C) 1,000 mg tablet Take 2 tablets by mouth daily.   ? Black Cohosh 540 mg cap Take 2 capsules by mouth at bedtime daily.   ? COLLAGEN MISC Take 2 capsules by mouth daily.   ? Cranberry Extract 500 mg cap Take 1 capsule by mouth daily.   ? duloxetine DR (CYMBALTA) 60 mg capsule Take 60 mg by mouth at bedtime daily.   ? estradioL (ESTRACE) 0.01 % (0.1 mg/g) vaginal cream For 14 days, insert/apply 0.5 g (1/2 fingertip unit) intravaginally and to vaginal opening once nightly. Then, apply 1 g (1 fingertip unit) in the same way twice weekly at bedtime.   ? ibuprofen (ADVIL) 200 mg tablet Take 200 mg by mouth every 6 hours. Take with food.   ? Lactobac no.41/Bifidobact no.7 (PROBIOTIC-10 PO) Take 1 capsule by mouth daily. 20 Billion    ? lifitegrast (XIIDRA) 5 % ophthalmic drop Apply 1 drop to both eyes every 12 hours.   ? melatonin 10 mg cap Take 30 mg by mouth at bedtime daily.   ? mv-mn-folic ac-vit K-herb 289 (ALIVE ONCE DAILY WOMEN 50 PLUS) 800-100 mcg tab Take  by mouth.   ? ondansetron (ZOFRAN) 4 mg tablet Take 4 mg by mouth once.   ? pilocarpine (SALAGEN (PILOCARPINE)) 5 mg tablet Take 5 mg by mouth four times daily.   ? polyethylene glycol 3350 (MIRALAX) 17 g packet Take 17 g by mouth daily.   ? safflower oil/linoleic acid,co (CLA PO) Take 1,300 mg by mouth twice daily.   ? sodium chloride (RETAINE NACL) 5 % ophthalmic solution Place 1 drop into or around eye(s) every 3 hours.   ? traMADoL (ULTRAM) 50 mg tablet Take one tablet by mouth daily.     Vitals:    01/22/20 1225 01/22/20 1237   BP: (!) 129/92 132/82   Pulse: 89    Resp: 16    Temp: 36.1 ?C (97 ?F)    SpO2: 100%    Weight: 75.6 kg (166 lb 9.6 oz)    Height: 170.2 cm (67)    PainSc: Four          Physical Exam  Vitals reviewed.   Constitutional:       General: She is not in acute distress.     Appearance: Normal appearance. She is well-developed and well-groomed.   HENT:      Head: Normocephalic and atraumatic.   Neck:      Thyroid: No thyroid mass or thyromegaly.   Cardiovascular:      Rate and Rhythm: Normal rate.   Pulmonary: Effort: Pulmonary effort is normal. No respiratory distress.   Genitourinary:     Vagina: No bleeding.      Cervix: Normal.      Uterus: Normal.       Adnexa: Left adnexa normal.   Musculoskeletal:      Cervical back: Normal range of motion and neck supple.   Neurological:      General: No focal deficit present.      Mental Status: She is alert and oriented to person, place, and time.   Psychiatric:         Mood and Affect: Mood normal.  Behavior: Behavior normal.          Assessment and Plan:  59 y.o. female with history of sicca syndrome, possible Sjogren's, lifelong chronic constipation, fibromyalgia seen today in follow-up for bladder pain syndrome and urinary incontinence.   - previously completed adequate PFPT (including manual myofascial therapy)     Continue amitriptyline 25 mg qhs  Offered further mgmt of urinary incontinence - pt declines at this time as it is manageable    RTC 6 mo

## 2020-01-22 ENCOUNTER — Encounter: Admit: 2020-01-22 | Discharge: 2020-01-22 | Payer: PRIVATE HEALTH INSURANCE

## 2020-01-22 ENCOUNTER — Ambulatory Visit: Admit: 2020-01-22 | Discharge: 2020-01-23 | Payer: Commercial Managed Care - HMO

## 2020-01-22 DIAGNOSIS — M359 Systemic involvement of connective tissue, unspecified: Secondary | ICD-10-CM

## 2020-01-22 DIAGNOSIS — B999 Unspecified infectious disease: Secondary | ICD-10-CM

## 2020-01-22 DIAGNOSIS — T8859XA Other complications of anesthesia, initial encounter: Secondary | ICD-10-CM

## 2020-01-22 DIAGNOSIS — M1990 Unspecified osteoarthritis, unspecified site: Secondary | ICD-10-CM

## 2020-01-22 MED ORDER — AMITRIPTYLINE 25 MG PO TAB
25 mg | ORAL_TABLET | Freq: Every evening | ORAL | 3 refills | Status: AC
Start: 2020-01-22 — End: ?

## 2020-01-22 NOTE — Progress Notes
Pertinent Review of Systems:  - General ROS: Denies recent weight change above 10 pounds, Denies malaise  - HEENT ROS: Denies blurring of vision. Denies double vision, Denie glaucoma, POSdry eyes  - Cardiovascular ROS: Denies chest pain. Denies palpitations currently. Denie orthopnea  - Respiratory ROS: Denies shortness of breath,  Denies cough,  Denies wheezing currently  - Gastrointestinal ROS: Denies constipation, Denies gastric reflux,  Denies blood in stool, Denies diarrhea, Denies Irritable bowel syndrome, Denies nausea, Denies vomiting, Denies abdominal pain  - Endocrine ROS: Denies polydipsia,  POS excessive sweating. POS hot flashes. Denies Thyroid disease  - Hematological and Lymphatic ROS: Denies easy bruisability , Denies current anemia, Denies adenopathy in the inguinal area  - Neurological ROS: Denie syncope, Denies numbness in lower extremities, Denies neuropathy. Denies shooting pain down the legs, Denies shooting pain in the lower back,POS low back pain  - Musculoskeletal ROS: POS Joint pain. Denies Edema in lower extremities. Denies fibromyalgia  - Psychological ROS: POS depression, POS anxiety, Denies thoughts of suicide, Denies thoughts of homicide, Denies recent seizure, Denies syncope   - Dermatological ROS: Denies eczema. Denies skin rashes in the groin area and other sites

## 2020-01-23 DIAGNOSIS — N3010 Interstitial cystitis (chronic) without hematuria: Secondary | ICD-10-CM

## 2020-02-24 ENCOUNTER — Encounter: Admit: 2020-02-24 | Discharge: 2020-02-24 | Payer: Commercial Managed Care - HMO

## 2020-02-24 ENCOUNTER — Encounter: Admit: 2020-02-24 | Discharge: 2020-02-24 | Payer: PRIVATE HEALTH INSURANCE

## 2020-02-24 DIAGNOSIS — B999 Unspecified infectious disease: Secondary | ICD-10-CM

## 2020-02-24 DIAGNOSIS — Z933 Colostomy status: Secondary | ICD-10-CM

## 2020-02-24 DIAGNOSIS — T8859XA Other complications of anesthesia, initial encounter: Secondary | ICD-10-CM

## 2020-02-24 DIAGNOSIS — M359 Systemic involvement of connective tissue, unspecified: Secondary | ICD-10-CM

## 2020-02-24 DIAGNOSIS — M1990 Unspecified osteoarthritis, unspecified site: Secondary | ICD-10-CM

## 2020-03-03 ENCOUNTER — Encounter: Admit: 2020-03-03 | Discharge: 2020-03-03 | Payer: PRIVATE HEALTH INSURANCE

## 2020-03-03 DIAGNOSIS — Z7189 Other specified counseling: Secondary | ICD-10-CM

## 2020-03-03 DIAGNOSIS — M1990 Unspecified osteoarthritis, unspecified site: Secondary | ICD-10-CM

## 2020-03-03 DIAGNOSIS — T8859XA Other complications of anesthesia, initial encounter: Secondary | ICD-10-CM

## 2020-03-03 DIAGNOSIS — B999 Unspecified infectious disease: Secondary | ICD-10-CM

## 2020-03-03 DIAGNOSIS — M359 Systemic involvement of connective tissue, unspecified: Secondary | ICD-10-CM

## 2020-03-03 NOTE — Progress Notes
Wound Ostomy Note    NAME:Janice Gregory                                                                   MRN: [id number]                 DOB:01-Aug-1960          AGE: 59 y.o.        Reason for Consult/Visit: ostomy clinic    Ostomy Type: colostomy  Location: left upper abdomen  Stoma Description: red  # of Stents: NA  Output: brown  Peristomal Skin: intact  Pouching System Used: 1 piece flat coloplast 95638 cut to 35mm with 8815 paste ring      Sitting up      flat    Pt came to clinic today with her husband.  She has been having leaking issues at home.  She has been dealing with skin breakdown but it does appear to be improving.  There is redness that is healing medial to her stoma due to the fabric tape.  She received samples of the 8958 soft convex from [employer] and has tried that over the last few pouch changes. She was using a flat pouch and I think the soft convex has solved her problem.  She does not like the length of the [employer] pouch, so we discussed the options available from coloplast and I showed her the 1 piece option.  She likes the look and feel of the coloplast mio pouch. Unfortunately I only have the flat version in the clinic, so we placed that with a paste ring so that she would have some convexity.  I am going to have samples of the coloplast flex drainable and closed end pouches sent for her to try.  I will also have [employer] send their 2 piece soft convex closed end pouch.  We also discussed trying a belt to help with stomal protrusion.  Questions answered.  Pt has our contact info if additional questions arise.     OSTOMY CARE:    -   Change pouch with any sign of leaking. Do not reinforce leaking pouch with tape  -   Clean skin with water only - no soap or wipes  -   If skin is broken down surrounding stoma, sprinkle stoma powder and wipe away the excess  -  Spray no sting skin prep on powdered skin and let dry completely  -  Cut new pouch leaving 1/8-1/4 of skin showing between the stoma and pouch  -  Warm new pouch and paste ring in palm of hands  -  Stretch paste ring to fit around stoma or on back of pouch where the hole was cut  -  Ensure skin is dry and apply new pouch  -  Lay warm hand over pouch once it's applied    [employer], RN, BSN, [employer], [employer]  Wound Ostomy Nursing Consult Service  Available via [employer] or [employer] M-F [phone]  Office number [phone]  Contact Team On Call after 4:00pm M-F/weekends/holidays

## 2020-03-04 ENCOUNTER — Encounter: Admit: 2020-03-04 | Discharge: 2020-03-04 | Payer: PRIVATE HEALTH INSURANCE

## 2020-03-04 ENCOUNTER — Ambulatory Visit: Admit: 2020-03-03 | Discharge: 2020-03-04 | Payer: Commercial Managed Care - HMO

## 2020-03-04 DIAGNOSIS — Z933 Colostomy status: Secondary | ICD-10-CM

## 2020-03-05 ENCOUNTER — Encounter: Admit: 2020-03-05 | Discharge: 2020-03-05 | Payer: PRIVATE HEALTH INSURANCE

## 2020-03-05 NOTE — Telephone Encounter
I returned pt's phone call regarding her ostomy pouch.  She was seen in clinic on Wednesday and unfortunately that pouch leaked yesterday.  I think she needs convexity and a belt.  She placed a [employer] [id number] and used the belt, this is exactly what I was going to tell her to do.  She does have extra supplies at home.  The Coloplast supplies that I requested after her clinic visit should arrive on Tuesday.  We reviewed what should be in the package so that she has an idea of what to try.  Questions answered.  She has my contact info if additional questions arise.    Lora Havens, RN, BSN, CMSRN, [employer]  Wound Ostomy Nursing Consult Service  Available via [employer] or [employer] M-F [phone]  Office number [phone]  Contact Team On Call after 4:00pm M-F/weekends/holidays

## 2020-04-19 ENCOUNTER — Encounter: Admit: 2020-04-19 | Discharge: 2020-04-19 | Payer: PRIVATE HEALTH INSURANCE

## 2020-04-19 DIAGNOSIS — N3010 Interstitial cystitis (chronic) without hematuria: Secondary | ICD-10-CM

## 2020-04-19 MED ORDER — AMITRIPTYLINE 50 MG PO TAB
50 mg | ORAL_TABLET | Freq: Every evening | ORAL | 3 refills | Status: AC
Start: 2020-04-19 — End: ?

## 2020-04-19 MED ORDER — AMITRIPTYLINE 25 MG PO TAB
50 mg | Freq: Every evening | ORAL | 0 refills
Start: 2020-04-19 — End: ?

## 2020-04-19 NOTE — Telephone Encounter
Received fax refill request from [facility] for amitriptyline 50MG .    Pharmacy comments: patient requests new RX: RX sent in August was inactivated.

## 2020-06-14 ENCOUNTER — Encounter: Admit: 2020-06-14 | Discharge: 2020-06-14 | Payer: PRIVATE HEALTH INSURANCE

## 2020-06-14 NOTE — Telephone Encounter
Spoke with Janice Gregory verbalized understanding of appointment on 06/29/2020 with Dr. Daphine Deutscher at 9:45am at [facility] location per patient request.

## 2020-06-14 NOTE — Telephone Encounter
-----   Message from Candida Peeling, APRN-NP sent at 06/14/2020 10:31 AM CST -----  Regarding: FW: Very Uncomfortable   Could you please call patient and get them an appointment with Dr. Eulah Pont, next available please?    Thanks,     Brunilda Payor. Fredricka Bonine MSN, APRN, AGCNS-BC, AGPCNP-BC  Advanced Practice Provider  Colon and Rectal Surgery  Surgical Oncology  APP for Dr. Kathee Delton, Dr. Daphine Deutscher and Dr. Daiva Nakayama  Pager: [phone]  Available via [facility] and [employer]    ----- Message -----  From: Tomie China  Sent: 06/14/2020  10:18 AM CST  To: [facility]  Subject: Very Uncomfortable                               It is like all the time. . Sitting, standing, sleeping and w/my hubby.

## 2020-06-29 ENCOUNTER — Encounter: Admit: 2020-06-29 | Discharge: 2020-06-29 | Payer: Commercial Managed Care - HMO

## 2020-06-29 ENCOUNTER — Encounter: Admit: 2020-06-29 | Discharge: 2020-06-29 | Payer: PRIVATE HEALTH INSURANCE

## 2020-06-29 DIAGNOSIS — M1990 Unspecified osteoarthritis, unspecified site: Secondary | ICD-10-CM

## 2020-06-29 DIAGNOSIS — T8859XA Other complications of anesthesia, initial encounter: Secondary | ICD-10-CM

## 2020-06-29 DIAGNOSIS — M359 Systemic involvement of connective tissue, unspecified: Secondary | ICD-10-CM

## 2020-06-29 DIAGNOSIS — B999 Unspecified infectious disease: Secondary | ICD-10-CM

## 2020-06-29 MED ORDER — HYDROCORTISONE 2.5 % TP CREA
Freq: Two times a day (BID) | TOPICAL | 2 refills | 30.00000 days | Status: AC
Start: 2020-06-29 — End: ?

## 2020-06-29 NOTE — Patient Instructions
Please administer 1 Fleets enema once monthly to clean the rectal stump. You can space this out if/when rectal discharge decreases.

## 2020-09-28 ENCOUNTER — Encounter: Admit: 2020-09-28 | Discharge: 2020-09-28 | Payer: PRIVATE HEALTH INSURANCE

## 2020-09-28 ENCOUNTER — Encounter: Admit: 2020-09-28 | Discharge: 2020-09-28 | Payer: Commercial Managed Care - HMO

## 2020-09-28 DIAGNOSIS — M359 Systemic involvement of connective tissue, unspecified: Secondary | ICD-10-CM

## 2020-09-28 DIAGNOSIS — M1990 Unspecified osteoarthritis, unspecified site: Secondary | ICD-10-CM

## 2020-09-28 DIAGNOSIS — B999 Unspecified infectious disease: Secondary | ICD-10-CM

## 2020-09-28 DIAGNOSIS — Z7189 Other specified counseling: Secondary | ICD-10-CM

## 2020-09-28 DIAGNOSIS — T8859XA Other complications of anesthesia, initial encounter: Secondary | ICD-10-CM

## 2020-09-28 NOTE — Patient Instructions
If you are having issues with your ostomy, please contact Ostomy nurse team at [phone].  You can continue to follow up with them if you have continued ostomy issues or concerns.

## 2020-10-26 ENCOUNTER — Encounter: Admit: 2020-10-26 | Discharge: 2020-10-26 | Payer: PRIVATE HEALTH INSURANCE

## 2020-10-26 ENCOUNTER — Ambulatory Visit: Admit: 2020-10-26 | Discharge: 2020-10-26 | Payer: Commercial Managed Care - HMO

## 2020-10-26 DIAGNOSIS — Z7189 Other specified counseling: Secondary | ICD-10-CM

## 2020-10-26 NOTE — Progress Notes
Wound Ostomy Note    NAME:Janice Gregory                                                                   MRN: [id number]                 DOB:05/30/1960          AGE: 60 y.o.      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 Daphine Deutscher with [facility]).   Pt gives hx of multiple abdominal surgeries resulting in scarring. In addition, she has lost weight since her surgery which has resulted in loose skin and creases. Pt reports her husband manages her pouch changes as she has impairments in fine motor and sensation of her hands. Pt reports longest wear time since surgery was 8 days; more recently, she had a 3 day wear time but it has now decreased to 12 hours. Pt has run out of supplies in efforts of trying to obtain pouch seal. Pt was seen by Dr. Daphine Deutscher in late May and was advised to size her pouch smaller to accommodate her stoma (25mm). Pt tried this without any resulting success. Pt also reports discomfort from pouch resting in between thighs - sharp edges of pouch tail. Pt has modified her system to alleviate this discomfort by rolling up lower aspect of pouch and securing with a binder clip. Pt is unable to use a closed-end pouch due to the frequency and consistency of her stool.     Abdomen assessed without pouch on in several positions - photos included below. Pt with significant changes in skin landscape around stoma that is likely making pouch seal challenging. Also observed was sizing for stoma was not wide enough to ensure pouch was not resting on stomal tissue. This concern was supported by pattern of stool trapping (oval shape) on adhesive surface of pouch after removal, despite pouch cut in circular shape.     SUPINE:  Stoma with oval shap and mild protrusion         SEATED UPRIGHT:   Stoma protrudes more, however, it settles into depression in skin. Skin fold above stoma begins to rest on stoma tissue.        STANDING:  Skin around stoma begins to sag and changes direction of oval shape of stoma.            EDUCATION:  1. Cut pouches to fit shape of stoma (oval) and 1/8 inch larger so that adhesive is not resting on stoma tissue as the moisture from the stoma itself weakens seal and can contribute to leakage. Pt inquired as to why her pouch would lose seal approx 1 day after change and when she would be in shower. Discussed that seal immediately around stoma was likely already weakened because it was resting on wet stoma tissue, this in combination with stool leakage, weight of pouch with stool contents and/or then water soaked pouch would pull on what residual seal was left and lead to leak.     2. If prefer to utilize round shape of pouch so that pre-cut pouch can be ordered, would  not cut smaller than 40mm and would utilize paste ring placed 1/8 inch away from stoma margin to help protect skin that would be exposed with round cut.     3. Continue use of convex rings to help make stoma protrude.    4. Pt can swim with an ostomy pouch    NEXT STEPS:  Samples of Coloplast one piece, convex, cut-to-fit, drainable pouch requested to pt's home with pt's permission. This would combine steps pt is currently having to perform.     Pt provided with contact information for the [employer] (phone and email) if she has questions or continues to have difficulties with pouch seal.     Marcelino Duster, RN, BSN, [employer]  Wound/Ostomy Nursing Consult Service  Available via Amie Critchley text or Paediatric nurse Team On Call through [place] after 4:00pm M-F/weekends/holidays

## 2020-12-24 ENCOUNTER — Encounter: Admit: 2020-12-24 | Discharge: 2020-12-24 | Payer: PRIVATE HEALTH INSURANCE

## 2020-12-24 DIAGNOSIS — N3010 Interstitial cystitis (chronic) without hematuria: Secondary | ICD-10-CM

## 2020-12-24 MED ORDER — AMITRIPTYLINE 50 MG PO TAB
50 mg | ORAL_TABLET | Freq: Every evening | ORAL | 0 refills | Status: AC
Start: 2020-12-24 — End: ?

## 2020-12-24 MED ORDER — AMITRIPTYLINE 50 MG PO TAB
50 mg | ORAL_TABLET | Freq: Every evening | ORAL | 0 refills | Status: DC
Start: 2020-12-24 — End: 2020-12-24

## 2020-12-24 NOTE — Telephone Encounter
Pt LM requesting her Amitriptyline rx to be sent to [facility] for cost purposes. Pt provided fx # [id number].    Spoke to pt. Advised will cancel script at local pharmacy and fax to [facility] pharmacy per her request. Pt inquired if she can have an additional script for 50mg  for break through flares in the evening. Advised would discuss with [facility]. Pt v/u.

## 2021-01-14 ENCOUNTER — Encounter: Admit: 2021-01-14 | Discharge: 2021-01-14 | Payer: Commercial Managed Care - HMO

## 2021-01-14 ENCOUNTER — Ambulatory Visit: Admit: 2021-01-14 | Discharge: 2021-01-14 | Payer: Commercial Managed Care - HMO

## 2021-01-14 DIAGNOSIS — N3010 Interstitial cystitis (chronic) without hematuria: Secondary | ICD-10-CM

## 2021-01-14 DIAGNOSIS — M359 Systemic involvement of connective tissue, unspecified: Secondary | ICD-10-CM

## 2021-01-14 DIAGNOSIS — R3989 Other symptoms and signs involving the genitourinary system: Secondary | ICD-10-CM

## 2021-01-14 DIAGNOSIS — T8859XA Other complications of anesthesia, initial encounter: Secondary | ICD-10-CM

## 2021-01-14 DIAGNOSIS — N952 Postmenopausal atrophic vaginitis: Secondary | ICD-10-CM

## 2021-01-14 DIAGNOSIS — B999 Unspecified infectious disease: Secondary | ICD-10-CM

## 2021-01-14 DIAGNOSIS — M1990 Unspecified osteoarthritis, unspecified site: Secondary | ICD-10-CM

## 2021-01-14 DIAGNOSIS — M7918 Myalgia, other site: Secondary | ICD-10-CM

## 2021-01-14 DIAGNOSIS — N941 Unspecified dyspareunia: Secondary | ICD-10-CM

## 2021-01-14 MED ORDER — AMITRIPTYLINE 25 MG PO TAB
25 mg | ORAL_TABLET | Freq: Every evening | ORAL | 0 refills | Status: AC
Start: 2021-01-14 — End: ?

## 2021-01-14 MED ORDER — ESTRADIOL 0.01 % (0.1 MG/GRAM) VA CREA
1 g | VAGINAL | 3 refills | 43.00000 days | Status: AC
Start: 2021-01-14 — End: ?

## 2021-01-14 NOTE — Procedures
STRAIGHT CATH PROCEDURE    After verbal consent was obtained, the patient was positioned in the dorsal lithotomy position using stirrups.   The external urethral meatus was prepped with Hibiclens.  A lubricated 14 Fr catheter was inserted into the urethra without difficulty.    Through the catheter, the urine was drained into a sterile container.     The PVR was 140 mL within 25 minutes of voiding.

## 2021-01-14 NOTE — Progress Notes
CHIEF COMPLAINT:   Chief Complaint   Patient presents with   ? Medication Follow-up       Janice Gregory is a 60 y.o. female, with history of sicca syndrome, possible Sjogren's, chronic constipation, fibromyalgia, who presents for follow up regarding med check/interstitial cystitis.    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    She presents today for medication follow-up. She was last seen on 01/21/20. 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 Completed course of PFPT with manual fascial release with persistent pain, so amitriptyline was started for likely interstitial cystitis.     She reports worsening in her symptoms with almost every evening, has UTI-like symptoms. Describes pain as cramping, feels like I have to urinate, urinary frequency, urinary urgency, bladder pain.  Occurs before she takes amitriptyline, takes it at 7 PM.   Drinks decaf tea and decaf coffee, 1 c in the AM.   Also reports dyspareunia. Has not been using VET. Both her mother and mother-in-law passed away in the last year.     Pertinent Review of Systems:  - General ROS:  Denies recent weight change above 10 pounds,  Denies malaise  - HEENT ROS:  Denies blurring of vision.  Denies double vision,  Denies glaucoma, POS dry eyes  - Cardiovascular ROS:  Denies chest pain.  Denies palpitations currently.  Denies orthopnea  - Respiratory ROS:  Denies shortness of breath,  Denies cough,  Denies wheezing currently  - Gastrointestinal ROS:  Denies constipation,  Denies gastric reflux,  Denies blood in stool,  Denies diarrhea,  Denies Irritable bowel syndrome,  Denies nausea,  Denies vomiting,  Denies abdominal pain  - Endocrine ROS: POS polydipsia,  Denies excessive sweating. POS hot flashes.  Denies Thyroid disease  - Hematological and Lymphatic ROS: POS easy bruisability,  Denies current anemia,  Denies adenopathy in the inguinal area  - Neurological ROS:  Denies syncope,  Denies numbness in lower extremities,  Denies neuropathy.  Denies shooting pain down the legs,  Denies shooting pain in the lower back,  Denies low back pain  - Musculoskeletal ROS: POS Joint pain.  Denies Edema in lower extremities. POS fibromyalgia  - Psychological ROS:  Denies depression,  Denies anxiety,  Denies thoughts of suicide,  Denies thoughts of homicide,  Denies recent seizure,  Denies syncope   - Dermatological ROS:  Denies eczema.  Denies skin rashes in the groin area and other sites    ALLERGIES:  Patient has no known allergies.    MEDICATIONS:    Current Outpatient Medications:   ?  acetaminophen (TYLENOL) 325 mg tablet, Take three tablets by mouth every 8 hours as needed for Pain., Disp: 40 tablet, Rfl: 0  ?  amitriptyline (ELAVIL) 50 mg tablet, Take one tablet by mouth at bedtime daily., Disp: 90 tablet, Rfl: 0  ?  ascorbic acid (VITAMIN C PO), Take  by mouth., Disp: , Rfl:   ?  ascorbic acid 1,000 mg tablet, Take 2 tablets by mouth daily., Disp: , Rfl:   ?  Black Cohosh 540 mg cap, Take 2 capsules by mouth at bedtime daily., Disp: , Rfl:   ?  COLLAGEN MISC, Take 2 capsules by mouth daily., Disp: , Rfl:   ?  Cranberry Extract 500 mg cap, Take 1 capsule by mouth daily., Disp: , Rfl:   ?  estradioL (ESTRACE) 0.01 % (0.1 mg/g) vaginal cream, For 14 days, insert/apply 0.5 g (1/2 fingertip unit) intravaginally and to vaginal opening once nightly. Then, apply 1 g (1 fingertip unit) in the same way twice weekly at bedtime., Disp: 42.5 g, Rfl: 2  ?  hydrocortisone (HYTONE) 2.5 % topical cream, Apply  topically to affected area twice daily., Disp: 30 g, Rfl: 2  ? ibuprofen (ADVIL) 200 mg tablet, Take 200 mg by mouth every 6 hours. Take with food., Disp: , Rfl:   ?  Lactobac no.41/Bifidobact no.7 (PROBIOTIC-10 PO), Take 1 capsule by mouth daily. 20 Billion , Disp: , Rfl:   ?  lifitegrast (XIIDRA) 5 % ophthalmic drop, Apply 1 drop to both eyes every 12 hours., Disp: , Rfl:   ?  mv-mn-folic ac-vit K-herb 289 800-100 mcg tab, Take  by mouth., Disp: , Rfl:   ?  pilocarpine HCL (SALAGEN) 5 mg tablet, Take 5 mg by mouth four times daily., Disp: , Rfl:   ?  polyethylene glycol 3350 (MIRALAX) 17 g packet, Take 17 g by mouth daily., Disp: , Rfl:   ?  predniSONE (DELTASONE) 20 mg tablet, twice daily., Disp: , Rfl:   ?  safflower oil/linoleic acid,co (CLA PO), Take 1,300 mg by mouth twice daily., Disp: , Rfl:   ?  sodium chloride (MURO-5) 5 % ophthalmic solution, Place 1 drop into or around eye(s) every 3 hours., Disp: , Rfl:   ?  valACYclovir (VALTREX) 1 gram tablet, , Disp: , Rfl:     Medical History:   Diagnosis Date   ? Arthritis    ? Complication of anesthesia    ? Connective tissue disease (HCC)    ? Infection        Surgical History:   Procedure Laterality Date   ? ANORECTAL MANOMETRY N/A 11/11/2019    Performed by Eliott Nine, MD at [facility] ENDO   ? LAPAROSCOPIC COLOSTOMY N/A 12/15/2019    Performed by Benetta Spar, MD at [facility] OR   ? ARTHROPLASTY     ? FOOT SURGERY     ? HERNIA REPAIR     ? HX APPENDECTOMY     ? HX CHOLECYSTECTOMY     ? HX HEMORRHOIDECTOMY     ? HX HYSTERECTOMY     ? HX JOINT REPLACEMENT     ? HYSTERECTOMY     ? KNEE SURGERY         OBJECTIVE:    Vitals:    01/14/21 0753   BP: (!) 146/84   BP Source: Arm, Left Upper   Pulse: 73   Temp: 36.5 ?C (97.7 ?F)   Resp: 16   SpO2: 99%   PainSc: Zero   Weight: 75.3 kg (166 lb)   Height: 170.2 cm (5' 7)       Physical Exam  Constitutional:       General: She is not in acute distress.     Appearance: Normal appearance.   Genitourinary:      Urethral meatus normal.      No lesions in the vagina.      Right Labia: No rash, lesions or skin changes.     Left Labia: No lesions, skin changes or rash.     No vaginal discharge, bleeding or granulation tissue.      Moderate vaginal atrophy present.     Cervix is absent.  Uterus is absent.      No urethral prolapse or tenderness present.      Levator ani is tender and obturator internus is tender.   HENT:      Head: Normocephalic and atraumatic.   Cardiovascular:      Rate and Rhythm: Normal rate.   Pulmonary:      Effort: Pulmonary effort is normal. No respiratory distress.   Musculoskeletal:         General: No swelling or deformity.   Neurological:      General: No focal deficit present.      Mental Status: She is alert.   Skin:     General: Skin is warm and dry.   Psychiatric:         Mood and Affect: Mood normal.         Behavior: Behavior normal.   Vitals reviewed. Exam conducted with a chaperone present.          Urinalysis   Leuks - no  Nitrites - no  RBC/Heme - no    Urination  PVR (ml) 140 mL (cath)    IMPRESSION  60 y.o. female, with history of sicca syndrome, possible Sjogren's, chronic constipation, fibromyalgia, multiple pelvic surgeries, and colostomy, seen today for interstitial cystitis, with incomplete bladder emptying, myofascial pain of the pelvic floor, vaginal atrophy, and dyspareunia.      PLAN    Interstitial cystitis:  Previously stable on amitriptyline 50 mg nightly, now with increasing frequency of bladder pain/urgency/frequency in the evening.  We discussed possible component of intake of bladder irritants, especially decaf coffee and tea causing a worsening in her symptoms.  Straight cath urine sample sent for culture today to rule out UTI as contributing factor to worsening symptoms.  We discussed options of increasing amitriptyline dose to 75 mg nightly versus repeating cystoscopy (previously had a normal cystoscopy with outside urologist).  At this time, she would like to increase amitriptyline, but if her symptoms are refractory, then I would recommend cystoscopy.  We discussed that cystoscopy may identify ulcers or inflammation which would make her a candidate for bladder directed therapies.    Incomplete bladder emptying:  With good pelvic floor support thus prolapse not contributing.  Given borderline elevation, I recommend double voiding and we will recheck PVR next visit. Consider UDS if persistently elevated.     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  She did not have any internal pelvic floor muscle myofascial therapy.  We discussed that for reduced dyspareunia, pelvic floor physical therapy with internal manual release would likely be very important.  At this time, she reports that she can live with the dyspareunia.  We discussed that vaginal atrophy is likely playing a role in dyspareunia as well and I recommend that she restart estradiol vaginal cream.  Prescription sent today for estradiol cream 1 g per vagina 3 times weekly.      Follow-up:  Return in about 6 weeks (around 02/25/2021) for 6-8 wk med check/PVR check.

## 2021-01-17 ENCOUNTER — Encounter: Admit: 2021-01-17 | Discharge: 2021-01-17 | Payer: Commercial Managed Care - HMO

## 2021-01-17 MED ORDER — SULFAMETHOXAZOLE-TRIMETHOPRIM 800-160 MG PO TAB
1 | ORAL_TABLET | Freq: Two times a day (BID) | ORAL | 0 refills | Status: AC
Start: 2021-01-17 — End: ?

## 2021-01-17 NOTE — Telephone Encounter
-----   Message from Dalene Seltzer, PA-C sent at 01/17/2021  7:56 AM CDT -----  Positive urine culture. Please contact with patient with results.   RX: Bactrim DS 800mg  1 PO BID x 7 days.  Please advise patient to avoid sun exposure/ wear sunscreen to sun-exposed skin.  We had discussed increasing amitriptyline, but given the positive culture, she can take the antibiotics and if symptoms improve, she may not need to increase amitriptyline. If symptoms persist with antibiotics, she may increase amitriptyline as discussed.

## 2021-01-17 NOTE — Telephone Encounter
Spoke to pt. Relayed results and tx recs. Pt v/u. Pt inquired how she got this UTI. Pt was off of estrogen cream for some time. Pt has started using. Emphasized importance of using. Pt v/u and was appreciative.

## 2021-02-15 ENCOUNTER — Encounter: Admit: 2021-02-15 | Discharge: 2021-02-15 | Payer: Commercial Managed Care - HMO

## 2021-02-15 ENCOUNTER — Ambulatory Visit: Admit: 2021-02-15 | Discharge: 2021-02-15 | Payer: Commercial Managed Care - HMO

## 2021-02-15 DIAGNOSIS — M1990 Unspecified osteoarthritis, unspecified site: Secondary | ICD-10-CM

## 2021-02-15 DIAGNOSIS — R339 Retention of urine, unspecified: Secondary | ICD-10-CM

## 2021-02-15 DIAGNOSIS — T8859XA Other complications of anesthesia, initial encounter: Secondary | ICD-10-CM

## 2021-02-15 DIAGNOSIS — M359 Systemic involvement of connective tissue, unspecified: Secondary | ICD-10-CM

## 2021-02-15 DIAGNOSIS — N3010 Interstitial cystitis (chronic) without hematuria: Secondary | ICD-10-CM

## 2021-02-15 DIAGNOSIS — B999 Unspecified infectious disease: Secondary | ICD-10-CM

## 2021-02-15 DIAGNOSIS — R3989 Other symptoms and signs involving the genitourinary system: Secondary | ICD-10-CM

## 2021-02-15 DIAGNOSIS — N3281 Overactive bladder: Secondary | ICD-10-CM

## 2021-02-15 NOTE — Procedures
Pt voided 300 mL into hat.     STRAIGHT CATH PROCEDURE    After verbal consent was obtained, the patient was positioned in the dorsal lithotomy position using stirrups.   The external urethral meatus was prepped with Hibiclens.  A lubricated 14 Fr catheter was inserted into the urethra without difficulty.    Through the catheter, the urine was drained into a sterile container.     The PVR was 120 mL within 5 minutes of voiding.

## 2021-02-15 NOTE — Progress Notes
CHIEF COMPLAINT:   Chief Complaint   Patient presents with   ? Follow Up     6 Week        Janice Gregory is a 60 y.o., female, with history of sicca syndrome, possible Sjogren's, chronic constipation, fibromyalgia, who presents for follow up regarding med check/IC.    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  At that visit, she reported episodic flares of bladder pain, urinary urgency, and urinary frequency in the evening.  Recommended avoidance/limitation of decaf coffee and tea.  Straight cath urine sample was sent and showed greater than 100 K Klebsiella pneumonia complex resistant to ampicillin and ampicillin/sulbactam. Cath UA was negative for heme, leuks, nitrites.  She was treated with Bactrim given urinary symptoms and bacteria on culture.  We had also discussed possibly increasing amitriptyline from 50 to 75 mg nightly versus repeating cystoscopy (previously had normal cystoscopy with outside urologist).  She opted for trial of increasing amitriptyline. She was started on estradiol cream last visit.     She took Bactrim as prescribed for last positive urine culture without significant overall improvement in urinary symptoms. She thought symptoms were worse the first 48-72 hr after starting Bactrim and then toward the end thought maybe there was a small amt of improvement. Since this, she reports continued erratic symptoms of urinary urgency, urinary frequency, and urinary hesitancy, which she describes as feeling like her body does a Kegel or tenses bc it's going to hurt when she urinates. She denies dysuria.     She continues to take Elavil 50 mg at night.   She is using estradiol cream three times weekly.     Pertinent Review of Systems:  - General ROS:  Denies recent weight change above 10 pounds,  Denies malaise  - HEENT ROS:  Denies blurring of vision.  Denies double vision,  Denies glaucoma, POS dry eyes  - Cardiovascular ROS:  Denies chest pain.  Denies palpitations currently.  Denies orthopnea  - Respiratory ROS:  Denies shortness of breath,  Denies cough,  Denies wheezing currently  - Gastrointestinal ROS:  Denies constipation,  Denies gastric reflux,  Denies blood in stool,  Denies diarrhea,  Denies Irritable bowel syndrome,  Denies nausea,  Denies vomiting,  Denies abdominal pain  - Endocrine ROS: POS polydipsia,  Denies excessive sweating. POS hot flashes.  Denies Thyroid disease  - Hematological and Lymphatic ROS:  Denies easy bruisability,  Denies current anemia,  Denies adenopathy in the inguinal area  - Neurological ROS:  Denies syncope,  Denies numbness in lower extremities,  Denies neuropathy.  Denies shooting pain down the legs,  Denies shooting pain in the lower back, POS low back pain  - Musculoskeletal ROS: POS Joint pain.  Denies Edema in lower extremities. POS fibromyalgia  - Psychological ROS:  Denies depression,  Denies anxiety,  Denies thoughts of suicide,  Denies thoughts of homicide,  Denies recent seizure,  Denies syncope   - Dermatological ROS:  Denies eczema. Denies skin rashes in the groin area and other sites      ALLERGIES:  Patient has no known allergies.    MEDICATIONS:    Current Outpatient Medications:   ?  acetaminophen (TYLENOL)  325 mg tablet, Take three tablets by mouth every 8 hours as needed for Pain., Disp: 40 tablet, Rfl: 0  ?  amitriptyline (ELAVIL) 25 mg tablet, Take one tablet by mouth at bedtime daily. With 50 mg tablet (for a total of 75 mg at bedtime daily)., Disp: 90 tablet, Rfl: 0  ?  amitriptyline (ELAVIL) 50 mg tablet, Take one tablet by mouth at bedtime daily., Disp: 90 tablet, Rfl: 0  ?  ascorbic acid (VITAMIN C PO), Take  by mouth., Disp: , Rfl:   ?  ascorbic acid 1,000 mg tablet, Take 2 tablets by mouth daily., Disp: , Rfl:   ?  Black Cohosh 540 mg cap, Take 2 capsules by mouth at bedtime daily., Disp: , Rfl:   ?  COLLAGEN MISC, Take 2 capsules by mouth daily., Disp: , Rfl:   ?  Cranberry Extract 500 mg cap, Take 1 capsule by mouth daily., Disp: , Rfl:   ?  estradioL (ESTRACE) 0.01 % (0.1 mg/g) vaginal cream, Insert or Apply one g to vaginal area three times weekly., Disp: 42.5 g, Rfl: 3  ?  hydrocortisone (HYTONE) 2.5 % topical cream, Apply  topically to affected area twice daily., Disp: 30 g, Rfl: 2  ?  ibuprofen (ADVIL) 200 mg tablet, Take 200 mg by mouth every 6 hours. Take with food., Disp: , Rfl:   ?  Lactobac no.41/Bifidobact no.7 (PROBIOTIC-10 PO), Take 1 capsule by mouth daily. 20 Billion , Disp: , Rfl:   ?  lifitegrast (XIIDRA) 5 % ophthalmic drop, Apply 1 drop to both eyes every 12 hours., Disp: , Rfl:   ?  mv-mn-folic ac-vit K-herb 289 800-100 mcg tab, Take  by mouth., Disp: , Rfl:   ?  pilocarpine HCL (SALAGEN) 5 mg tablet, Take 5 mg by mouth four times daily., Disp: , Rfl:   ?  polyethylene glycol 3350 (MIRALAX) 17 g packet, Take 17 g by mouth daily., Disp: , Rfl:   ?  predniSONE (DELTASONE) 20 mg tablet, twice daily., Disp: , Rfl:   ?  safflower oil/linoleic acid,co (CLA PO), Take 1,300 mg by mouth twice daily., Disp: , Rfl:   ?  sodium chloride (MURO-5) 5 % ophthalmic solution, Place 1 drop into or around eye(s) every 3 hours., Disp: , Rfl:   ?  valACYclovir (VALTREX) 1 gram tablet, , Disp: , Rfl:     Medical History:   Diagnosis Date   ? Arthritis    ? Complication of anesthesia    ? Connective tissue disease (HCC)    ? Infection        Surgical History:   Procedure Laterality Date   ? ANORECTAL MANOMETRY N/A 11/11/2019    Performed by Eliott Nine[facility] MD at [facility] ENDO   ? LAPAROSCOPIC COLOSTOMY N/A 12/15/2019    Performed by Benetta Spar, MD at [facility] OR   ? ARTHROPLASTY     ? FOOT SURGERY     ? HERNIA REPAIR     ? HX APPENDECTOMY     ? HX CHOLECYSTECTOMY     ? HX HEMORRHOIDECTOMY     ? HX HYSTERECTOMY     ? HX JOINT REPLACEMENT     ? HYSTERECTOMY     ? KNEE SURGERY         OBJECTIVE:    Vitals:    02/15/21 1412   BP: 120/79   Pulse: 72   Temp: 36.3 ?C (97.4 ?F)   Resp: 16   SpO2: 100%  PainSc: Zero   Weight: 75.9 kg (167 lb 6.4 oz)   Height: 172.7 cm (5' 8)       Physical Exam  Constitutional:       General: She is not in acute distress.     Appearance: Normal appearance.   Genitourinary:      Vulva exam comments: GU deferred - see office visit note 01/14/21 for most recent exam.     HENT:      Head: Normocephalic and atraumatic.   Pulmonary:      Effort: Pulmonary effort is normal. No respiratory distress.   Neurological:      General: No focal deficit present.      Mental Status: She is alert and oriented to person, place, and time.   Psychiatric:         Mood and Affect: Mood normal.         Behavior: Behavior normal.              Urinalysis   Leuks - no  Nitrites - no  RBC/Heme - no    Urination  VOID (ml) 300 mL  PVR (ml) 120 mL    IMPRESSION/PLAN  60 y.o., female, with history of multiple pelvic surgeries (see summary in HPI) and colostomy 12/15/19, seen today in follow-up for episodic urinary urgency, urinary frequency, urinary hesitancy, and incomplete bladder emptying. Suspected component of interstitial cystitis. Exam 01/14/21 notable for myofascial tenderness of pelvic floor muscles.     We discussed the recent positive urine culture on 01/14/21 with >100K Klebsiella pneumonia complex on a cath sample without markers of UTI on urinalysis.  This was treated given her urinary symptoms.  However, given lack of improvement with antibiotics, we discussed that this positive culture most likely represented asymptomatic bacteriuria and was not representative of a true UTI.  We discussed the waxing and waning nature of interstitial cystitis in the presence of asymptomatic bacteriuria compounding this diagnosis.  Continue estradiol vaginal cream 1 g per vagina 3 times weekly at bedtime.  UA neg today. No c/s sent given this.  I would like to review her case with my supervising physician and notify the patient via phone of recommendations. We discussed possibility of pelvic floor PT and/or cystoscopy and/or urodynamics studies.        Follow-up: TBD    Total Time Today was 30 minutes in the following activities: Preparing to see the patient, Performing a medically appropriate examination and/or evaluation, Counseling and educating the patient/family/caregiver, Ordering medications, tests, or procedures, Documenting clinical information in the electronic or other health record and Care coordination (not separately reported)

## 2021-02-22 ENCOUNTER — Encounter: Admit: 2021-02-22 | Discharge: 2021-02-22 | Payer: Commercial Managed Care - HMO

## 2021-02-22 NOTE — Telephone Encounter
Linden L Schirm called and reports she is having pain in the LUQ around her stoma. She does report having seen her PCP and they wanted her to follow up with our office. PCP mentioned to patient concerns for hernia, but pt has additional concerns today.     SHe reports acute pain, lack of output from ostomy since yesterday. She reports pain started Sunday and has progressively gotten worse. She feels she has to support her area around the ostomy while walking around, but pain is present at all times, worse when up and about. Patient was asked about chills/fever. Pt reports she has chills, but no fever. Denies N/V, has not eatten today as she is "scared" to eat for fear of obstruction. Ostomy although not giving much output, is red/pink in color.    I discussed obstruction s/s and hernia s/s. Pt aware to present to ED if acute pain, has nausea and vomiting, fevers/chills and continued lack of output despite interventions discussed.     Pt sent Mychart message with obstruction education and was advised remain on liquid diet while working on increasing ostomy output. Advised pt can take 1 dose Miralax Q1-2H PRN to produce output. Lay flat on back, pull knees to chest and rock back and forth. Take warm bath 15-42minutes, gently massage with palms of hands around abdomen and around ostomy site.     Pt scheduled on 10/25 with Deberah Pelton, NP for follow up to evaluate the area of concern. Aware she can present to ED if obstruction concerns do not resolve or pain should worsen without relief on position changes.

## 2021-02-24 NOTE — Progress Notes
..  This prechart is intended to be a reference for patient appointments. Information is gathered from in chart as well as external records review. Information will be clarified/verified/updated in final documentation in the office visit.     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  She was having significant fecal incontinence on a daily basis that significantly affected her quality of life. 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    Problem List:  Fecal Incontinence     12/15/2019 - 1) Laparoscopic lysis of adhesions 2) Primary repair of small bowel serosal injuries 3) End descending colostomy  Findings:  Dense small bowel adhesions to prior midline incision - mesh?, lysis of adhesions >60 minutes, serosal injuries primarily repaired, well perfused end descending colostomy

## 2021-03-01 ENCOUNTER — Encounter: Admit: 2021-03-01 | Discharge: 2021-03-01 | Payer: Commercial Managed Care - HMO

## 2021-03-01 DIAGNOSIS — T8859XA Other complications of anesthesia, initial encounter: Secondary | ICD-10-CM

## 2021-03-01 DIAGNOSIS — M359 Systemic involvement of connective tissue, unspecified: Secondary | ICD-10-CM

## 2021-03-01 DIAGNOSIS — Z933 Colostomy status: Secondary | ICD-10-CM

## 2021-03-01 DIAGNOSIS — M1990 Unspecified osteoarthritis, unspecified site: Secondary | ICD-10-CM

## 2021-03-01 DIAGNOSIS — B999 Unspecified infectious disease: Secondary | ICD-10-CM

## 2021-03-01 MED ORDER — CYCLOBENZAPRINE 5 MG PO TAB
2.5-5 mg | ORAL_TABLET | Freq: Three times a day (TID) | ORAL | 0 refills | 30.00000 days | Status: AC | PRN
Start: 2021-03-01 — End: ?

## 2021-03-01 NOTE — Progress Notes
Name: Janice Gregory          MRN: [id number]      DOB: 06-Apr-1961      AGE: 60 y.o.   DATE OF SERVICE: 03/01/2021    Subjective:             Reason for Visit:  Follow Up      Janice Gregory is a 60 y.o. female.     Cancer Staging  No matching staging information was found for the patient.    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  She was having significant fecal incontinence on a daily basis that significantly affected her quality of life. 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    She returns today due to ongoing abdominal pain.  This was very severe pain last week around her stoma.  This occurred after she was lifting heavy rocks into buckets and then moving the buckets filled with rocks. The pain is described as tearing.  Has been using Tylenol and ibuprofen without significant improvement.  She did get in the tub which did help.  She had a few days without any stool output and she increased her MiraLAX.  She is now taking 2 doses of MiraLAX per day and 2 stool softeners twice a day.  She does have stool moving through now.  It is not as consistent as it was prior to this acute onset of pain.  She feels that there is a ball on top of her stoma.  She wonders if she has developed a hernia.  She saw her primary care provider who recommended she discuss with the surgeon.  She has not had any imaging.    12/15/2019 - 1) Laparoscopic lysis of adhesions 2) Primary repair of small bowel serosal injuries 3) End descending colostomy  Findings:  Dense small bowel adhesions to prior midline incision - mesh?, lysis of adhesions >60 minutes, serosal injuries primarily repaired, well perfused end descending colostomy    Medical History:   Diagnosis Date   ? Arthritis    ? Complication of anesthesia    ? Connective tissue disease (HCC)    ? Infection      Surgical History:   Procedure Laterality Date   ? ANORECTAL MANOMETRY N/A 11/11/2019    Performed by Eliott Nine, MD at [facility]   ? LAPAROSCOPIC COLOSTOMY N/A 12/15/2019    Performed by Benetta Spar, MD at [facility] OR   ? ARTHROPLASTY     ? FOOT SURGERY     ? HERNIA REPAIR     ? HX APPENDECTOMY     ? HX CHOLECYSTECTOMY     ? HX HEMORRHOIDECTOMY     ? HX HYSTERECTOMY     ? HX JOINT REPLACEMENT     ? HYSTERECTOMY     ? KNEE SURGERY       Family History   Problem Relation Age of Onset   ? Osteoporosis Mother    ? Arthritis Mother    ? Heart Attack Father    ? Osteoporosis Sibling    ? Arthritis Sibling    ? Scoliosis Sibling  Social History     Socioeconomic History   ? Marital status: Married   Tobacco Use   ? Smoking status: Former Smoker     Quit date: 05/08/1977     Years since quitting: 43.8   ? Smokeless tobacco: Never Used   Vaping Use   ? Vaping Use: Never used   Substance and Sexual Activity   ? Alcohol use: Never   ? Drug use: Never   ? Sexual activity: Not Currently     Vaping/E-liquid Use   ? Vaping Use Never User                      Review of Systems   Constitutional: Positive for activity change. Negative for chills, fever and unexpected weight change.   HENT: Negative.    Eyes: Positive for pain.   Respiratory: Negative.    Cardiovascular: Negative.    Gastrointestinal: Positive for abdominal pain. Negative for abdominal distention, anal bleeding, blood in stool, constipation, diarrhea, nausea, rectal pain and vomiting.   Endocrine: Negative.    Genitourinary: Negative.    Musculoskeletal: Negative.    Skin: Negative.  Negative for color change and wound. Allergic/Immunologic: Negative.    Neurological: Negative.  Negative for weakness and light-headedness.   Hematological: Negative.    Psychiatric/Behavioral: Negative.          Objective:         ? acetaminophen (TYLENOL) 325 mg tablet Take three tablets by mouth every 8 hours as needed for Pain.   ? amitriptyline (ELAVIL) 25 mg tablet Take one tablet by mouth at bedtime daily. With 50 mg tablet (for a total of 75 mg at bedtime daily).   ? amitriptyline (ELAVIL) 50 mg tablet Take one tablet by mouth at bedtime daily.   ? ascorbic acid (VITAMIN C PO) Take  by mouth.   ? ascorbic acid 1,000 mg tablet Take 2 tablets by mouth daily.   ? Black Cohosh 540 mg cap Take 2 capsules by mouth at bedtime daily.   ? COLLAGEN MISC Take 2 capsules by mouth daily.   ? Cranberry Extract 500 mg cap Take 1 capsule by mouth daily.   ? cyclobenzaprine (FLEXERIL) 5 mg tablet Take one-half tablet to one tablet by mouth three times daily as needed for Muscle Cramps.   ? estradioL (ESTRACE) 0.01 % (0.1 mg/g) vaginal cream Insert or Apply one g to vaginal area three times weekly.   ? hydrocortisone (HYTONE) 2.5 % topical cream Apply  topically to affected area twice daily.   ? ibuprofen (ADVIL) 200 mg tablet Take 200 mg by mouth every 6 hours. Take with food.   ? Lactobac no.41/Bifidobact no.7 (PROBIOTIC-10 PO) Take 1 capsule by mouth daily. 20 Billion    ? lifitegrast (XIIDRA) 5 % ophthalmic drop Apply 1 drop to both eyes every 12 hours.   ? mv-mn-folic ac-vit K-herb 289 800-100 mcg tab Take  by mouth.   ? nystatin (MYCOSTATIN) 100,000 unit/g topical cream Apply  topically to affected area twice daily.   ? pilocarpine HCL (SALAGEN) 5 mg tablet Take 5 mg by mouth four times daily.   ? polyethylene glycol 3350 (MIRALAX) 17 g packet Take 17 g by mouth daily.   ? predniSONE (DELTASONE) 20 mg tablet twice daily.   ? safflower oil/linoleic acid,co (CLA PO) Take 1,300 mg by mouth twice daily.   ? sodium chloride (MURO-5) 5 % ophthalmic solution Place 1 drop into or around eye(s) every 3  hours.   ? valACYclovir (VALTREX) 1 gram tablet      Vitals:    03/01/21 1104   BP: 121/81   BP Source: Arm, Left Upper   Pulse: 78   Temp: 36.5 ?C (97.7 ?F)   Resp: 17   SpO2: 100%   TempSrc: Temporal   PainSc: Six  Comment: worse when standing and walking; area where the stone is located (left to mid abd)   Weight: 75.2 kg (165 lb 12.8 oz)     Body mass index is 25.21 kg/m?Marland Kitchen     Pain Score: Six (worse when standing and walking; area where the stone is located (left to mid abd))       Fatigue Scale: 4       Physical Exam  Vitals reviewed.   Constitutional:       General: She is not in acute distress.     Appearance: Normal appearance. She is well-developed.   HENT:      Head: Normocephalic and atraumatic.      Nose: Nose normal.   Eyes:      General: Lids are normal.      Conjunctiva/sclera: Conjunctivae normal.   Pulmonary:      Effort: Pulmonary effort is normal. No respiratory distress.   Abdominal:       Musculoskeletal:         General: Normal range of motion.      Cervical back: Normal range of motion.   Neurological:      Mental Status: She is alert and oriented to person, place, and time.   Psychiatric:         Speech: Speech normal.         Behavior: Behavior normal.         Thought Content: Thought content normal.         Judgment: Judgment normal.               Assessment and Plan:  60yo female with colostomy for fecal incontinence. New bulge and pain around stoma after moving rocks.     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   - For now continue to avoid heavy lifting.   - Tylenol/ibuporfen/heating pain to abdomen for pain.   - Flexeril 2.5-5mg  TID PRN for abdominal pain. Avoid narcotics due to continued constipation.   - Continue with Miralax BID and Colace BID.     Plan:  - RTC pending outcome for CT scan.     .The patient and family were allowed to ask questions and voice concerns; these were addressed to the best of our ability. They expressed understanding of what was explained to them, and they agreed with the present plan. Patient has the phone numbers for the Cancer Center and was instructed on how to contact us with any questions or concerns.    My collaborating provider on this patient is Dr. Deedra Ehrich MD.    Brunilda Payor. Fredricka Bonine MSN, APRN, AGCNS-BC, AGPCNP-BC  Advanced Practice Provider  Colon and Rectal Surgery  Surgical Oncology  APP for Dr. Kathee Delton, Dr. Daphine Deutscher and Dr. Daiva Nakayama  Pager: [phone]  Available via Amie Critchley and [facility] [employer]

## 2021-03-01 NOTE — Progress Notes
AUTH NEEDED FOR EXTERNAL IMAGING    Patient info:  Name: Janice Gregory    MRN: [id number]          DOB: Dec 18, 1960      Insurance:   Payor: CIGNA / Plan: CIGNA NON PPO/EPO / Product Type: HMO /      Order sent to Facility:  Not until pre-cert complete    Type of scan ordered:  CT abd w/o contrast    Name of facility where test will be performed:  [facility]    Diagnosis ICD10 Code:  Z93.3    Ordering Provider:  Deberah Pelton, APRN-NP    Scan needed by date:  03/08/2021

## 2021-03-01 NOTE — Progress Notes
Per RTE, Pt is eligible for [employer] benefits. Initiation in process.

## 2021-03-01 NOTE — Progress Notes
Spoke w/ Destiny A at [facility] [phone]). CT ABD W/O CONTRAST 74150 requires medical review. Faxed to [phone]: office visits from 03/01/2021, MRI DEFECOGRAPHY from 09/01/2019, labs from 01/06/2020, 02/15/2021, 01/14/2021. Case #[id number]. Pending review. Turn around time: 2-4 days.

## 2021-03-03 ENCOUNTER — Encounter: Admit: 2021-03-03 | Discharge: 2021-03-03 | Payer: Commercial Managed Care - HMO

## 2021-03-03 NOTE — Telephone Encounter
Patient paged this provider as she [facility] told her they have not received orders. I told her that I have not received authorization about the CT scan to send which is why they have not received anything. Patient says she would like to do this at [facility] as they are booking out 2wks. I told patient that I would change this and have a scheduler call her tomorrow and arrange this.     Brunilda Payor. Fredricka Bonine MSN, APRN, AGCNS-BC, AGPCNP-BC  Advanced Practice Provider  Colon and Rectal Surgery  Surgical Oncology  APP for Dr. Kathee Delton, Dr. Daphine Deutscher and Dr. Daiva Nakayama  Pager: [phone]  Available via Amie Crit[facility]ley and [facility]

## 2021-03-04 ENCOUNTER — Encounter: Admit: 2021-03-04 | Discharge: 2021-03-04 | Payer: Commercial Managed Care - HMO

## 2021-03-04 DIAGNOSIS — Z933 Colostomy status: Secondary | ICD-10-CM

## 2021-03-04 DIAGNOSIS — K435 Parastomal hernia without obstruction or  gangrene: Secondary | ICD-10-CM

## 2021-03-04 NOTE — Telephone Encounter
-----   Message from Norman Herrlich, RN sent at 03/04/2021  9:02 AM CDT -----  Diagnosis code (ICD10): parastomal hernia     Ordering Doctor/APP: Fredricka Bonine  Imaging:   Pt's preference     Has the patient been contacted about this appointment? Yes    Comments for appointment: Please book pt for CT Abd at [place].

## 2021-03-04 NOTE — Telephone Encounter
Spoke with Dimple Nanas verbalized understanding of CT (abdomen) on 03/08/2021 at 4:45pm with 4:30pm arrival time at [facility] per patient request.

## 2021-03-07 ENCOUNTER — Encounter: Admit: 2021-03-07 | Discharge: 2021-03-07 | Payer: Commercial Managed Care - HMO

## 2021-03-07 NOTE — Telephone Encounter
Pre-cert approved for [facility]. Order faxed to [phone], received fax confirmation.  Provided Janice Gregory with phone number to schedule [phone].

## 2021-03-07 NOTE — Telephone Encounter
Spoke with Janice Gregory and notified her that her insurance plan requires use of a free standing imaging facility.  It will not be covered here at [place] so her scan has been cancelled.      Janice Gregory expressed she would like this scan done asap.  They are ok using [facility].  Will assure pre-cert is approved prior to scheduling.

## 2021-03-07 NOTE — Progress Notes
Spoke w/ Claudia A @ [facility] [phone]). CT ABD/PELV W/O CONTRAST 74176 has been approved. [facility] [place], [place]. Auth # [id number]. Valid from 03/07/2021 thru 09/03/2021. Patient is okay to proceed.  Note: Previous request for CPT 74150 at [facility] has been canceled.

## 2021-03-07 NOTE — Progress Notes
Per [employer] web site, Pt is eligible for [employer] benefits. Initiation in process.

## 2021-03-07 NOTE — Progress Notes
AUTH NEEDED FOR EXTERNAL IMAGING    Patient info:  Name: Janice Gregory    MRN: [id number]          DOB: 09/25/1960      Insurance:   Payor: CIGNA / Plan: CIGNA NON PPO/EPO / Product Type: HMO /      Order sent to Facility:  Not until pre-cert complete    Type of scan ordered:  CT abd/pelv without contrast    Name of facility where test will be performed:  [facility] in [place]    Diagnosis ICD10 Code:  569.69  V44.3    Ordering Provider:  Deberah Pelton, APRN    Scan needed by date:  ASAP

## 2021-03-09 ENCOUNTER — Encounter: Admit: 2021-03-09 | Discharge: 2021-03-09 | Payer: Commercial Managed Care - HMO

## 2021-03-09 NOTE — Telephone Encounter
Spoke with Janice Gregory regarding CT results.  Shows hernia, but not obstructed.  Recommend return appt with Dr. Daphine Deutscher, scheduled 05/10/21.  Will place patient on waitlist in case of cancellations.  In meantime discussed recommendations for hernia belt, ostomy clinic, keep stools soft as discussed in office visit.  Notify us if interested in ostomy referral.  Patient reports she made her own hernia belt, but causes her pain.  She states she does have the information from [place] on purchasing a hernia belt if she is interested.  She expresses discomfort when stool is passing through her stoma. Encouraged her to continue to keep stools soft.  Pt aware to present to ED if acute pain, nausea and vomiting, fevers/chills and lack of gas/output.

## 2021-03-15 ENCOUNTER — Ambulatory Visit: Admit: 2021-03-15 | Discharge: 2021-03-15 | Payer: Commercial Managed Care - HMO

## 2021-03-15 ENCOUNTER — Encounter: Admit: 2021-03-15 | Discharge: 2021-03-15 | Payer: Commercial Managed Care - HMO

## 2021-03-15 DIAGNOSIS — N3281 Overactive bladder: Secondary | ICD-10-CM

## 2021-03-15 DIAGNOSIS — B999 Unspecified infectious disease: Secondary | ICD-10-CM

## 2021-03-15 DIAGNOSIS — R3989 Other symptoms and signs involving the genitourinary system: Secondary | ICD-10-CM

## 2021-03-15 DIAGNOSIS — M1990 Unspecified osteoarthritis, unspecified site: Secondary | ICD-10-CM

## 2021-03-15 DIAGNOSIS — N3946 Mixed incontinence: Secondary | ICD-10-CM

## 2021-03-15 DIAGNOSIS — N398 Other specified disorders of urinary system: Secondary | ICD-10-CM

## 2021-03-15 DIAGNOSIS — N3080 Other cystitis without hematuria: Secondary | ICD-10-CM

## 2021-03-15 DIAGNOSIS — N390 Urinary tract infection, site not specified: Secondary | ICD-10-CM

## 2021-03-15 DIAGNOSIS — M7918 Myalgia, other site: Secondary | ICD-10-CM

## 2021-03-15 DIAGNOSIS — N941 Unspecified dyspareunia: Secondary | ICD-10-CM

## 2021-03-15 DIAGNOSIS — R339 Retention of urine, unspecified: Secondary | ICD-10-CM

## 2021-03-15 DIAGNOSIS — N3020 Other chronic cystitis without hematuria: Secondary | ICD-10-CM

## 2021-03-15 DIAGNOSIS — M359 Systemic involvement of connective tissue, unspecified: Secondary | ICD-10-CM

## 2021-03-15 DIAGNOSIS — T8859XA Other complications of anesthesia, initial encounter: Secondary | ICD-10-CM

## 2021-03-15 MED ORDER — AMOXICILLIN 500 MG PO CAP
500 mg | Freq: Once | ORAL | 0 refills | Status: AC
Start: 2021-03-15 — End: ?

## 2021-03-15 MED ORDER — CEFUROXIME AXETIL 250 MG PO TAB
250 mg | ORAL_TABLET | Freq: Two times a day (BID) | ORAL | 0 refills | Status: AC
Start: 2021-03-15 — End: ?

## 2021-03-15 MED ORDER — CEPHALEXIN 250 MG PO CAP
250 mg | ORAL_CAPSULE | Freq: Every day | ORAL | 2 refills | Status: AC
Start: 2021-03-15 — End: ?

## 2021-03-15 NOTE — Procedures
_______________________________________________________________________________________    Procedure Note:    Patient name: Janice Gregory  Date of Birth: 07-06-1960  March 15, 2021     CHIEF COMPLAINT:   Chief Complaint   Patient presents with   ? Procedure     Cysto   ?    Procedure(s):? Diagnostic cystourethroscopy    Surgeon(s): Dr. Robbie Lis    Anesthesia : Topical 2% lidocaine gel    Indications for Procedure:    1. Urgency-frequency syndrome  CYSTOSCOPY   2. Mixed stress and urge urinary incontinence  CYSTOSCOPY   3. Dyspareunia in female  CYSTOSCOPY   4. Bladder pain  CYSTOSCOPY   5. Myalgia of pelvic floor  CYSTOSCOPY   6. Voiding dysfunction  CYSTOSCOPY        The risks, benefits, complications, treatment options, and expected outcomes were discussed with the patient. The patient concurred with the proposed plan, giving informed consent. There are no contraindications to the procedure.    Procedure details:? The patient was taken to the cystoscopy suite and a time out was taken to confirm the patient's identity and procedure.? The cystourethroscopy was perfomed today under LOCAL anethesia using 10ml of 2% lidocaine jelly in the urethra/bladder. The patient was placed in lithotomy position, prepped with Chlorhexidine Scrub, and draped in the usual sterile fashion. A 15.5 Fr. sheath flexible digital cystoscope was used to inspect both the urethra and bladder with sterile water infusion.    Findings:    Urethra:   There were inflammatory findings with trigonitis ( mild ) .  No diverticula or debris extracted with massage of the posterior urethra under direct visualization.  No cysts, or neoplastic lesions.  There were no strictures or evidence of injury/foreign body erosion from prior surgery.  Palpation of prior sling vaginally revealed placement in the     Bladder:   Survey of the anatomy revealed normal location of ureters, both demonstrating flow of urine.  There was no evidence of a fixed dome. She had left bladder gutter (left paravaginal cystocele).    There were findings of bladder inflammation with petechiae, and hypervascularity.   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    Prominent bowel at the dome, reduces when filling.     There were no neoplastic lesions.      There were no foreign bodies (stones, mesh, suture).    Rare Trabeculations were  present.      Complications: None    Estimated blood loss: None    Medications:    Pyridium 200mg  1 PO    Specimens: None    Disposition: Stable condition.

## 2021-03-15 NOTE — Progress Notes
Pertinent Review of Systems:  - General ROS:  Denies recent weight change above 10 pounds,  Denies malaise  - HEENT ROS:  Denies blurring of vision.  Denies double vision,  Denies glaucoma,  Denies dry eyes  - Cardiovascular ROS:  Denies chest pain.  Denies palpitations currently.  Denies orthopnea  - Respiratory ROS:  Denies shortness of breath,  Denies cough,  Denies wheezing currently  - Gastrointestinal ROS:  Denies constipation,  Denies gastric reflux,  Denies blood in stool,  Denies diarrhea,  Denies Irritable bowel syndrome,  Denies nausea,  Denies vomiting,  Denies abdominal pain  - Endocrine ROS:  Denies polydipsia,  Denies excessive sweating.  Denies hot flashes.  Denies Thyroid disease  - Hematological and Lymphatic ROS:  Denies easy bruisability,  Denies current anemia,  Denies adenopathy in the inguinal area  - Neurological ROS:  Denies syncope,  Denies numbness in lower extremities,  Denies neuropathy.  Denies shooting pain down the legs,  Denies shooting pain in the lower back,  Denies low back pain  - Musculoskeletal ROS:  Denies Joint pain.  Denies Edema in lower extremities.  Denies fibromyalgia  - Psychological ROS:  Denies depression,  Denies anxiety,  Denies thoughts of suicide,  Denies thoughts of homicide,  Denies recent seizure,  Denies syncope   - Dermatological ROS:  Denies eczema.  Denies skin rashes in the groin area and other sites

## 2021-03-15 NOTE — Progress Notes
Pertinent Review of Systems:  - General ROS:  Denies recent weight change above 10 pounds,  Denies malaise  - HEENT ROS:  Denies blurring of vision.  Denies double vision,  Denies glaucoma,  Denies dry eyes  - Cardiovascular ROS:  Denies chest pain.  Denies palpitations currently.  Denies orthopnea  - Respiratory ROS:  Denies shortness of breath,  Denies cough,  Denies wheezing currently  - Gastrointestinal ROS:  Denies constipation,  Denies gastric reflux,  Denies blood in stool,  Denies diarrhea,  Denies Irritable bowel syndrome,  Denies nausea,  Denies vomiting,  Denies abdominal pain  - Endocrine ROS:  Denies polydipsia,  Denies excessive sweating.  Denies hot flashes.  Denies Thyroid disease  - Hematological and Lymphatic ROS:  Denies easy bruisability,  Denies current anemia,  Denies adenopathy in the inguinal area  - Neurological ROS:  Denies syncope,  Denies numbness in lower extremities,  Denies neuropathy.  Denies shooting pain down the legs,  Denies shooting pain in the lower back,  Denies low back pain  - Musculoskeletal ROS:  Denies Joint pain.  Denies Edema in lower extremities.  Denies fibromyalgia  - Psychological ROS:  Denies depression,  Denies anxiety,  Denies thoughts of suicide,  Denies thoughts of homicide,  Denies recent seizure,  Denies syncope   - Dermatological ROS:  Denies eczema. Denies skin rashes in the groin area and other sites

## 2021-03-15 NOTE — Patient Instructions
Today you underwent a urodynamics procedure.  It is normal for some patients to feel discomfort with voiding (urinating) for the following 24 hours.  You may see a pink color of urine on the toilet paper with wiping after the first few times you void (urinate).      Urinary tract infections can occur after urodynamics, although very rarely.  We often administer an oral antibiotic to you at the time of the procedure.  If your physician has concerns that you are more at risk of developing a urinary tract infection, you will be prescribed additional antibiotics to take for several more days.      Please call our office if you develop symptoms of a UTI:   Burning with urination lasting more than 24 hours after the procedure   Bladder pressure    New onset of increased frequency of needing to go to the bathroom in the day or night   Increased urgency associated with voiding/urinating   New onset of low back pain that is not in the middle of the back   Chills or low grade fever (temperature above 101.4)    Symptoms of a more severe urinary tract infection that could involve the kidneys are uncommon.  Please go to the nearest emergency room or urgent care if you develop:   Bloody urine   Mental confusion   Mid back pain   Temperature >101.4 degrees   Chills

## 2021-03-15 NOTE — Progress Notes
URODYNAMIC CLINIC REPORT     Janice Gregory was seen for UDS on  03/15/2021 for the following indications:    Urge, Frequency and pain     The patient was brought to the urodynamics room. She was asked to void with evaluation by uroflow. A urine dip confirmed no evidence of infection.     A 7 Fr urodynamic catheter was placed intravesically (Pura, Pves) and in the vagina  (Pabd). There was reduction of  vaginal prolapse with 1 colpotip . EMG stickers were affixed to the bilateral buttock and grounded on the patient's knee. The patient was in the sitting position and was asked to cough to confirm proper placement of the pressure catheters. Cystometrogram was performed with filling at up to 50 mL/min. Incremental sensations of fullness were recorded, and a cough stress test and valsalva stress test were performed as below. A urethral closure pressure profile with a puller evaluated the urethral patency. . The patient was asked to void with the catheters in place for a pressure flow study. At the conclusion of the study all of the catheters, EMG stickers, and prolapse reducing devices were removed.     UROFLOW:         Maximum flow:   75ml/s          Average flow:   8.1 ml/s      Voided volume:   355 ml       Post void residual: 150 ml (cath)       Cath specimen dip  (neg)nitrites        CYSTOMETROGRAM:   First sensation:   251 ml    (normal is 175-315ml) 50% of maximum capacity  First desire:   304 ml  (normal is 260-423ml) 75% of maximum capacity  Strong desire:   620 ml    (normal is 315-543ml  90 % of maximum capacity  Maximum capacity:  622 ml  (normal is 350-678ml)    Are uninhibited detrusor contractions present? NO Yes @  mL associated with incontinence   Are induced detrussor contractions present?   PATIENT REPORTS THAT SHE IS FEELING AN URGE THAT GETS STRONGER AND THEN DISSIPATES  This occurs while filling  And after stress test x 3 Yes @  mL with fillingcoughvalsalva    LEAK POINT PRESSURE:  NO LEAKING   At mL leak with cough valsalva @ cm H2O (calculated)   At mL leak with cough valsalva @ cm H2O (calculated)     URETHRAL CLOSURE PRESSURE PROFILE:   Max urethral pressure  cmH2O.       PRESSURE FLOW STUDIES   Max flow rate   59ml/sec   Average flow rate  9.4 ml/sec   Pressure at peak flow  60 cmH2O   Volume voided   602 ml  PVR   22 ml (calculated)  __________________________________________________________________________________

## 2021-03-15 NOTE — Patient Instructions
Today you underwent a cystoscopy procedure. It is normal to feel discomfort with voiding (urinating) for the next 24 hours. It is also not uncommon to notice a pink color on the toilet paper with wiping after the first few times you void (urinate).    Urinary tract infections can occur after cystoscopy. We often administer an antibiotic to you at the time of the procedure. If your physician has concerns that you are more at risk of developing a urinary tract infection, you will be prescribed additional antibiotics to take for several more days.    Please call our office if you develop symptoms of a UTI:   Burning with urination lasting more than 24 hours after the procedure   Bladder pressure   Increased frequency of needing to go to the bathroom in the day or night   Increaced urgency associated with voiding/urinating   Low back pain   Chills or low grade fever    Symptoms of a more severe urinary tract infection that could involve the kidneys are uncommon. Please go the nearest emergency room or urgent care if you develop:   Bloody urine   Mental confusion   Mid back pain   Temperature >101 degrees   Chills

## 2021-03-21 ENCOUNTER — Encounter: Admit: 2021-03-21 | Discharge: 2021-03-21 | Payer: Commercial Managed Care - HMO

## 2021-03-21 DIAGNOSIS — N3010 Interstitial cystitis (chronic) without hematuria: Secondary | ICD-10-CM

## 2021-03-21 MED ORDER — AMITRIPTYLINE 25 MG PO TAB
ORAL_TABLET | Freq: Every day | 0 refills
Start: 2021-03-21 — End: ?

## 2021-03-31 ENCOUNTER — Encounter: Admit: 2021-03-31 | Discharge: 2021-03-31 | Payer: Commercial Managed Care - HMO

## 2021-03-31 DIAGNOSIS — N3010 Interstitial cystitis (chronic) without hematuria: Secondary | ICD-10-CM

## 2021-03-31 MED ORDER — AMITRIPTYLINE 50 MG PO TAB
ORAL_TABLET | Freq: Every day | 0 refills
Start: 2021-03-31 — End: ?

## 2021-04-07 ENCOUNTER — Encounter: Admit: 2021-04-07 | Discharge: 2021-04-07 | Payer: Commercial Managed Care - HMO

## 2021-04-07 ENCOUNTER — Ambulatory Visit: Admit: 2021-04-07 | Discharge: 2021-04-07 | Payer: Commercial Managed Care - HMO

## 2021-04-07 DIAGNOSIS — M1990 Unspecified osteoarthritis, unspecified site: Secondary | ICD-10-CM

## 2021-04-07 DIAGNOSIS — B999 Unspecified infectious disease: Secondary | ICD-10-CM

## 2021-04-07 DIAGNOSIS — M359 Systemic involvement of connective tissue, unspecified: Secondary | ICD-10-CM

## 2021-04-07 DIAGNOSIS — N3020 Other chronic cystitis without hematuria: Principal | ICD-10-CM

## 2021-04-07 DIAGNOSIS — T8859XA Other complications of anesthesia, initial encounter: Secondary | ICD-10-CM

## 2021-04-07 MED ORDER — HUGGINS CLINIC BLADDER IRRIGATION
Freq: Once | INTRAVESICAL | 0 refills | Status: CP
Start: 2021-04-07 — End: ?
  Administered 2021-04-07 (×5): 50.000 mL via INTRAVESICAL

## 2021-04-07 NOTE — Patient Instructions
Today you underwent a bladder instillation procedure. Please void in one hour to eliminate the medication. It can be normal to feel mild discomfort with voiding (urinating) for the next 24 hours.     Urinary tract infections are infrequent but can occur after bladder instillations.     Please call our office if you develop symptoms of a UTI:   Burning with urination lasting more than 24 hours after the procedure   Bladder pressure   Increased frequency of needing to go to the bathroom in the day or night   Increaced urgency associated with voiding/urinating   Low back pain   Chills or low grade fever

## 2021-04-07 NOTE — Progress Notes
Pertinent Review of Systems:  - General ROS:  Denies recent weight change above 10 pounds,  Denies malaise  - HEENT ROS:  Denies blurring of vision.  Denies double vision,  Denies glaucoma,  Denies dry eyes  - Cardiovascular ROS:  Denies chest pain.  Denies palpitations currently.  Denies orthopnea  - Respiratory ROS:  Denies shortness of breath,  Denies cough,  Denies wheezing currently  - Gastrointestinal ROS:  Denies constipation,  Denies gastric reflux,  Denies blood in stool,  Denies diarrhea,  Denies Irritable bowel syndrome,  Denies nausea,  Denies vomiting,  Denies abdominal pain  - Endocrine ROS:  Denies polydipsia,  Denies excessive sweating.  Denies hot flashes.  Denies Thyroid disease  - Hematological and Lymphatic ROS:  Denies easy bruisability,  Denies current anemia,  Denies adenopathy in the inguinal area  - Neurological ROS:  Denies syncope,  Denies numbness in lower extremities, Denies neuropathy.  Denies shooting pain down the legs,  Denies shooting pain in the lower back,  Denies low back pain  - Musculoskeletal ROS:  Denies Joint pain.  Denies Edema in lower extremities.  Denies fibromyalgia  - Psychological ROS:  Denies depression,  Denies anxiety,  Denies thoughts of suicide,  Denies thoughts of homicide,  Denies recent seizure,  Denies syncope   - Dermatological ROS:  Denies eczema.  Denies skin rashes in the groin area and other sites

## 2021-04-07 NOTE — Procedures
A time out was completed at 0830 for the instill procedure with the patient and Nurse Jewell Ryans. The patient was identified, a brief history was reviewed, the procedure was identified along with the correct site, consent was obtained (previously signed consent form for duration of bladder instillation treatment protocol), and necessary equipment/medications were obtained and available.      Instillation # 1    Nocturia     2-3    Bladder pain     4+/10    Bladder pressure    4+/10    Comments:         Medications used in the instillation:   8.4% Sodium Bicarbonate 69ml, intravesically weekly   Heparin 5,000 units/mL 8 mL (40,000 units) intravesically weekly   2% Lidocaine HCL 20 mL intravesically weekly   Solu-Cortef 100mg /56mL  29mL (200mg /29mL) intravesically weekly   0.9% Normal Saline 20 mL intravesically weekly           Instillation Procedure   1. Prepped patients external urethra with hibiclens.    2.Using sterile technique, inserted sterile straight catheter into urethra and     obtained a post void residual of  40 mL.     3. Set catheterized urine aside for urinalysis, if needed.     4. Attach [place] syringe to end of the catheter, inverted and poured medication       into [place] syringe.      5. Administered medication through catheter.    6. Removed catheter and disposed of soiled supplies.    7. Did patient teaching:     * Pt was instructed to avoid voiding for 1 hour.    * Pt instructed to call if having any complications or side effects.     Pt Verbalized Understanding    Patient tolerated instillation without difficulty

## 2021-04-11 ENCOUNTER — Encounter: Admit: 2021-04-11 | Discharge: 2021-04-11 | Payer: Commercial Managed Care - HMO

## 2021-04-21 ENCOUNTER — Ambulatory Visit: Admit: 2021-04-21 | Discharge: 2021-04-21 | Payer: Commercial Managed Care - HMO

## 2021-04-21 ENCOUNTER — Encounter: Admit: 2021-04-21 | Discharge: 2021-04-21 | Payer: Commercial Managed Care - HMO

## 2021-04-21 DIAGNOSIS — N3020 Other chronic cystitis without hematuria: Secondary | ICD-10-CM

## 2021-04-21 DIAGNOSIS — M1990 Unspecified osteoarthritis, unspecified site: Secondary | ICD-10-CM

## 2021-04-21 DIAGNOSIS — T8859XA Other complications of anesthesia, initial encounter: Secondary | ICD-10-CM

## 2021-04-21 DIAGNOSIS — B999 Unspecified infectious disease: Secondary | ICD-10-CM

## 2021-04-21 DIAGNOSIS — M359 Systemic involvement of connective tissue, unspecified: Secondary | ICD-10-CM

## 2021-04-21 MED ORDER — LIDOCAINE HCL 2 % MM JELP
TOPICAL | 0 refills | Status: AC | PRN
Start: 2021-04-21 — End: ?

## 2021-04-21 MED ORDER — HUGGINS CLINIC BLADDER IRRIGATION
Freq: Once | INTRAVESICAL | 0 refills | Status: CP
Start: 2021-04-21 — End: ?

## 2021-04-21 NOTE — Patient Instructions
Today you underwent a bladder instillation procedure. Please void in one hour to eliminate the medication. It can be normal to feel mild discomfort with voiding (urinating) for the next 24 hours.     Urinary tract infections are infrequent but can occur after bladder instillations.     Please call our office if you develop symptoms of a UTI:   Burning with urination lasting more than 24 hours after the procedure   Bladder pressure   Increased frequency of needing to go to the bathroom in the day or night   Increaced urgency associated with voiding/urinating   Low back pain   Chills or low grade fever

## 2021-04-28 ENCOUNTER — Encounter: Admit: 2021-04-28 | Discharge: 2021-04-28 | Payer: Commercial Managed Care - HMO

## 2021-05-04 ENCOUNTER — Encounter: Admit: 2021-05-04 | Discharge: 2021-05-04 | Payer: Commercial Managed Care - HMO

## 2021-05-05 ENCOUNTER — Ambulatory Visit: Admit: 2021-05-05 | Discharge: 2021-05-05 | Payer: Commercial Managed Care - HMO

## 2021-05-05 ENCOUNTER — Encounter: Admit: 2021-05-05 | Discharge: 2021-05-05 | Payer: Commercial Managed Care - HMO

## 2021-05-05 DIAGNOSIS — B999 Unspecified infectious disease: Secondary | ICD-10-CM

## 2021-05-05 DIAGNOSIS — R3989 Other symptoms and signs involving the genitourinary system: Secondary | ICD-10-CM

## 2021-05-05 DIAGNOSIS — T8859XA Other complications of anesthesia, initial encounter: Secondary | ICD-10-CM

## 2021-05-05 DIAGNOSIS — M1990 Unspecified osteoarthritis, unspecified site: Secondary | ICD-10-CM

## 2021-05-05 DIAGNOSIS — N3020 Other chronic cystitis without hematuria: Secondary | ICD-10-CM

## 2021-05-05 DIAGNOSIS — M359 Systemic involvement of connective tissue, unspecified: Secondary | ICD-10-CM

## 2021-05-05 MED ORDER — URIBEL 118-10-40.8-36 MG PO CAP
1 | ORAL_CAPSULE | Freq: Four times a day (QID) | ORAL | 0 refills | 90.00000 days | Status: AC
Start: 2021-05-05 — End: ?

## 2021-05-05 MED ORDER — HUGGINS CLINIC BLADDER IRRIGATION
Freq: Once | INTRAVESICAL | 0 refills | Status: CP
Start: 2021-05-05 — End: ?
  Administered 2021-05-05 (×5): 50.000 mL via INTRAVESICAL

## 2021-05-05 MED ORDER — PHENAZOPYRIDINE 100 MG PO TAB
ORAL_TABLET | 0 refills
Start: 2021-05-05 — End: ?

## 2021-05-05 NOTE — Procedures
A time out was completed at for the [facility] procedure with pt,  and Nurse Taquanna Borras . The patient was identified, a brief history was reviewed, the procedure was identified along with the correct site, consent form was signed and reviewed, and necessary equipment/medications were obtained and available.    Instillation # 4    Frequency daytime    q 1-2 hours     Nocturia      203    Bladder pain     Off and on /10    Bladder pressure    Off and on /10    Urinary urgency    0/10    Feeling of incomplete bladder emptying 0/10    Number of leaking episodes    0/10    Comments: 0        Medications used in the instillation:   8.4% Sodium Bicarbonate 24ml, intravesically weekly   Heparin 5,000 units/mL 8 mL (40,000 units) intravesically weekly   2% Lidocaine HCL 20 mL intravesically weekly   Solu-Cortef 100mg /35mL  12mL (200mg /90mL) intravesically weekly   0.9% Normal Saline 20 mL intravesically weekly           Instillation Procedure   1. Prepped patients external urethra with hibiclens.    2.Using sterile technique, inserted sterile straight catheter into urethra and     obtained a post void residual of  <1 mL.     3. There was not enough urine to send for culture, so Ellie Bulmash,PA-C sent an order to her local lab to leave a urine culture.     4. Attach [place] syringe to end of the catheter, inverted and poured medication       into [place] syringe.      5. Administered medication through catheter.    6. Removed catheter and disposed of soiled supplies.    7. Did patient teaching:     * Pt was instructed to avoid voiding for 1 hour.    * Pt instructed to call if having any complications or side effects.     Pt Verbalized Understanding    Patient tolerated instillation without difficulty

## 2021-05-05 NOTE — Progress Notes
Date of Service: 05/05/2021    Subjective:             Janice Gregory is a 60 y.o. 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    History of Present Illness  Patient was last seen on 04/21/2021 for bladder instillation, after which time she had urethral discomfort.  When she arrived home she took an extra dose of amitriptyline which seemed to reduce but not resolve her symptoms.  Urethral discomfort and urinary hesitancy seem to last for several days.  She considered performing a home UTI test (Azo strip) but did not perform.  She chose to wait until today.   When patient arrived she voided only a very small amount of urine which was not collected.  When patient was catheterized and bladder drained for the bladder instillation, there was only a very few drops of urine collected, and unable to be tested.  Patient presented the above symptoms after her bladder instillation was in progress.      Regarding vaginal estrogen therapy, she states she has not been using since she began the bladder instillations.  She has used cranberry extract for several years.  Patient does not use d-mannose supplement.    Patient's PCP is Dr. Fredia Sorrow in [place], [place], whom she sees annually for wellness care.  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    She states that she has had routine annual screening labs to include CBC and CHEM profile.     Patient reports having a connective tissue disease for which she sees a specialist (unsure of name).  Previously was prescribed duloxetine, which she has discontinued.    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         Pertinent Review of Systems:  - General ROS: Denies recent weight change above 10 pounds, Denies malaise  - HEENT ROS: Denies blurring of vision. Denies double vision, Denies glaucoma, Denies dry eyes  - Cardiovascular ROS: Denies chest pain. Denies palpitations currently. Denies orthopnea  - Respiratory ROS: Denies shortness of breath,  Denies cough,  Denies wheezing currently  - Gastrointestinal ROS: Denies constipation, Denies gastric reflux,  Denies blood in stool, Denies diarrhea, Denies Irritable bowel syndrome, Denies nausea, Denies vomiting, Denies abdominal pain  - Endocrine ROS: Denies polydipsia,  Denies excessive sweating. Denies hot flashes. Denies Thyroid disease  - Hematological and Lymphatic ROS: Denies easy bruisability , Denies current anemia, Denies adenopathy in the inguinal area  - Neurological ROS: Denies syncope, Denies numbness in lower extremities, Denies neuropathy. Denies shooting pain down the legs, Denies shooting pain in the lower back, Denies low back pain  - Musculoskeletal ROS: Denies Joint pain. Denies Edema in lower extremities. Denies fibromyalgia  - Psychological ROS: Denies depression, Denies anxiety, Denies thoughts of suicide, Denies thoughts of homicide, Denies recent seizure, Denies syncope   - Dermatological ROS: Denies eczema. Denies skin rashes in the groin area and other sites  ?    Medical History:   Diagnosis Date   ? Arthritis    ? Complication of anesthesia    ? Connective tissue disease (HCC)    ? Infection        Surgical History:   Procedure Laterality Date   ? ANORECTAL MANOMETRY N/A 11/11/2019    Performed by Eliott Nine, MD at  [facility]   ? LAPAROSCOPIC COLOSTOMY N/A 12/15/2019    Performed by Benetta Spar, MD at [facility]   ? ARTHROPLASTY     ? FOOT SURGERY     ? HERNIA REPAIR     ? HX APPENDECTOMY     ? HX CHOLECYSTECTOMY     ? HX HEMORRHOIDECTOMY     ? HX HYSTERECTOMY     ? HX JOINT REPLACEMENT     ? HYSTERECTOMY     ? KNEE SURGERY           Objective:         ? acetaminophen (TYLENOL) 325 mg tablet Take three tablets by mouth every 8 hours as needed for Pain.   ? amitriptyline (ELAVIL) 25 mg tablet Take 1 tablet by mouth daily at bedtime with 50 mg tablet for a total of 75 mg daily at bedtime.   ? amitriptyline (ELAVIL) 50 mg tablet Take one tablet by mouth at bedtime daily.   ? ascorbic acid (VITAMIN C PO) Take  by mouth.   ? ascorbic acid 1,000 mg tablet Take 2 tablets by mouth daily.   ? Black Cohosh 540 mg cap Take 2 capsules by mouth at bedtime daily.   ? cephalexin (KEFLEX) 250 mg capsule Take one capsule by mouth daily for 90 days.   ? COLLAGEN MISC Take 2 capsules by mouth daily.   ? Cranberry Extract 500 mg cap Take 1 capsule by mouth daily.   ? estradioL (ESTRACE) 0.01 % (0.1 mg/g) vaginal cream Insert or Apply one g to vaginal area three times weekly.   ? hydrocortisone (HYTONE) 2.5 % topical cream Apply  topically to affected area twice daily.   ? ibuprofen (ADVIL) 200 mg tablet Take 200 mg by mouth every 6 hours. Take with food.   ? Lactobac no.41/Bifidobact no.7 (PROBIOTIC-10 PO) Take 1 capsule by mouth daily. 20 Billion    ? lifitegrast (XIIDRA) 5 % ophthalmic drop Apply 1 drop to both eyes every 12 hours.   ? mv-mn-folic ac-vit K-herb 289 800-100 mcg tab Take  by mouth.   ? nystatin (MYCOSTATIN) 100,000 unit/g topical cream Apply  topically to affected area twice daily.   ? pilocarpine HCL (SALAGEN) 5 mg tablet Take 5 mg by mouth four times daily.   ? polyethylene glycol 3350 (MIRALAX) 17 g packet Take 17 g by mouth daily.   ? safflower oil/linoleic acid,co (CLA PO) Take 1,300 mg by mouth twice daily.   ? sodium chloride (MURO-5) 5 % ophthalmic solution Place 1 drop into or around eye(s) every 3 hours.   ? valACYclovir (VALTREX) 1 gram tablet      Vitals:    05/05/21 0812   BP: (!) 143/66   Pulse: 79   Temp: 36.2 ?C (97.2 ?F)   Resp: 16   SpO2: 99%   TempSrc: Temporal   PainSc: Zero   Weight: 74.8 kg (165 lb)   Height: 172.7 cm (5' 8)     Body mass index is 25.09 kg/m?Marland Kitchen     Physical Exam  Vitals reviewed.   HENT:      Head: Normocephalic.   Cardiovascular:      Rate and Rhythm: Normal rate. Pulmonary:      Effort: Pulmonary effort is normal. No respiratory distress.   Skin:     General: Skin is warm and dry.   Neurological:      General: No focal deficit present.      Mental Status:  She is alert and oriented to person, place, and time.   Psychiatric:         Mood and Affect: Mood normal.         Behavior: Behavior normal.         Thought Content: Thought content normal.         Judgment: Judgment normal.            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 Bladder pain  methen-m.blue-s.phos-phsal-hyo (URIBEL) 118-10-40.8-36 mg capsule    CULTURE-URINE W/SENSITIVITY    URINALYSIS, MICROSCOPIC    CANCELED: POC URINE DIPSTICK AUTO READ        -- Patient proceeded with bladder instillation today.  --Given that patient lives greater than 1 hour from our office, in order for a micro UA as well as urine C&S was faxed to Dr. Kendal Hymen Tackett's office.  Patient will have this done later today.   Encouraged p.o. fluids.   We also requested that she have them forward [place] her recent CMP and CBC if 1 is on file.   --For urethral and bladder discomfort a trial prescription is sent for Uribel 4 times daily discomfort.  Patient will report back as needed if symptoms are improved with this regimen.  She will not begin until a urine specimen is collected later today.   --We will follow-up with results and management as may be indicated.    Roland Rack, PA-C  Urogynecology APP        Total Time Today was 28 minutes in the following activities: Preparing to see the patient, Obtaining and/or reviewing separately obtained history, Performing a medically appropriate examination and/or evaluation, Counseling and educating the patient/family/caregiver, Ordering medications, tests, or procedures and Documenting clinical information in the electronic or other health record

## 2021-05-05 NOTE — Patient Instructions
Today you underwent a bladder instillation procedure. Please void in one hour to eliminate the medication. It can be normal to feel mild discomfort with voiding (urinating) for the next 24 hours.     Urinary tract infections are infrequent but can occur after bladder instillations.     Please call our office if you develop symptoms of a UTI:   Burning with urination lasting more than 24 hours after the procedure   Bladder pressure   Increased frequency of needing to go to the bathroom in the day or night   Increaced urgency associated with voiding/urinating   Low back pain   Chills or low grade fever

## 2021-05-05 NOTE — Progress Notes
Pertinent Review of Systems:  - General ROS: Denies recent weight change above 10 pounds, Denies malaise  - HEENT ROS: Denies blurring of vision. Denies double vision, Denies glaucoma, Denies dry eyes  - Cardiovascular ROS: Denies chest pain. Denies palpitations currently. Denies orthopnea  - Respiratory ROS: Denies shortness of breath,  Denies cough,  Denies wheezing currently  - Gastrointestinal ROS: Denies constipation, Denies gastric reflux,  Denies blood in stool, Denies diarrhea, Denies Irritable bowel syndrome, Denies nausea, Denies vomiting, Denies abdominal pain  - Endocrine ROS: Denies polydipsia,  Denies excessive sweating. Denies hot flashes. Denies Thyroid disease  - Hematological and Lymphatic ROS: Denies easy bruisability , Denies current anemia, Denies adenopathy in the inguinal area  - Neurological ROS: Denies syncope, Denies numbness in lower extremities, Denies neuropathy. Denies shooting pain down the legs, Denies shooting pain in the lower back, Denies low back pain  - Musculoskeletal ROS: Denies Joint pain. Denies Edema in lower extremities. Denies fibromyalgia  - Psychological ROS: Denies depression, Denies anxiety, Denies thoughts of suicide, Denies thoughts of homicide, Denies recent seizure, Denies syncope   - Dermatological ROS: Denies eczema. Denies skin rashes in the groin area and other sites

## 2021-05-06 ENCOUNTER — Encounter: Admit: 2021-05-06 | Discharge: 2021-05-06 | Payer: Commercial Managed Care - HMO

## 2021-05-06 MED ORDER — CEFDINIR 300 MG PO CAP
300 mg | ORAL_CAPSULE | Freq: Two times a day (BID) | ORAL | 0 refills | 7.00000 days | Status: AC
Start: 2021-05-06 — End: ?

## 2021-05-06 NOTE — Telephone Encounter
Jannette reports UTI symptoms.  She has left urine for culture at Dr [facility] office.  Results were not available for review.  Rx for cefdinir 300mg  sent in per verbal order , PA-C.  She is aware that she may need to change antibiotic if bacteria is not susceptible.

## 2021-05-10 ENCOUNTER — Encounter: Admit: 2021-05-10 | Discharge: 2021-05-10 | Payer: BC Managed Care – PPO

## 2021-05-10 ENCOUNTER — Inpatient Hospital Stay: Admit: 2021-05-10 | Discharge: 2021-05-10 | Payer: BC Managed Care – PPO

## 2021-05-10 DIAGNOSIS — T8859XA Other complications of anesthesia, initial encounter: Secondary | ICD-10-CM

## 2021-05-10 DIAGNOSIS — M1990 Unspecified osteoarthritis, unspecified site: Secondary | ICD-10-CM

## 2021-05-10 DIAGNOSIS — B999 Unspecified infectious disease: Secondary | ICD-10-CM

## 2021-05-10 DIAGNOSIS — M359 Systemic involvement of connective tissue, unspecified: Secondary | ICD-10-CM

## 2021-05-10 DIAGNOSIS — K435 Parastomal hernia without obstruction or  gangrene: Secondary | ICD-10-CM

## 2021-05-10 MED ORDER — ACETAMINOPHEN 500 MG PO TAB
1000 mg | Freq: Once | ORAL | 0 refills
Start: 2021-05-10 — End: ?

## 2021-05-10 MED ORDER — CEFAZOLIN IN 0.9% SOD CHLORIDE 2 GRAM/110 ML IVPB
2 g | Freq: Once | INTRAVENOUS | 0 refills
Start: 2021-05-10 — End: ?

## 2021-05-10 MED ORDER — GABAPENTIN 300 MG PO CAP
600 mg | Freq: Once | ORAL | 0 refills
Start: 2021-05-10 — End: ?

## 2021-05-10 MED ORDER — PRESURGERY KIT A
PACK | 0 refills | Status: CN
Start: 2021-05-10 — End: ?

## 2021-05-10 MED ORDER — NALOXEGOL 25 MG PO TAB
25 mg | Freq: Once | ORAL | 0 refills
Start: 2021-05-10 — End: ?

## 2021-05-10 MED ORDER — METRONIDAZOLE 500 MG PO TAB
ORAL_TABLET | 0 refills | Status: CN
Start: 2021-05-10 — End: ?

## 2021-05-10 MED ORDER — SODIUM CHLORIDE 0.9 % IV SOLP
250 mL | INTRAVENOUS | 0 refills
Start: 2021-05-10 — End: ?

## 2021-05-10 MED ORDER — CELECOXIB 100 MG PO CAP
200 mg | Freq: Once | ORAL | 0 refills
Start: 2021-05-10 — End: ?

## 2021-05-10 MED ORDER — METRONIDAZOLE IN NACL (ISO-OS) 500 MG/100 ML IV PGBK
500 mg | Freq: Once | INTRAVENOUS | 0 refills
Start: 2021-05-10 — End: ?

## 2021-05-10 MED ORDER — CIPROFLOXACIN HCL 500 MG PO TAB
ORAL_TABLET | 0 refills | Status: CN
Start: 2021-05-10 — End: ?

## 2021-05-10 NOTE — Patient Instructions
[facility]  Surgery Instructions    Surgery Date: 06/20/2021  (Please be aware that this appointment for your surgery will not be visible in the MyChart portal.)    Location:  Your surgery will be held at [facility] ([address], [place], [place] [address].)  Where to park on the day of surgery: [employer] (located just [place] [address] on [place])  Where to check in on the day of surgery: In the Admitting office (located on Level 1 of hospital of the [employer] [facility])      Arrival Time:   The OR scheduling office must finalize the OR schedule before we can provide a time for your surgery. Please do not call your physicians office about surgery arrival time. Your surgery start time and arrive times will be called out to you the day before your procedure between 2pm-4:30pm. If you have not heard from the hospital regarding your surgery time by 4:30PM the evening before your surgery, please call [phone].  The day of the procedure, if you cannot keep your appointment, or are delayed, please contact the surgery center immediately at [phone].      COVID-19 Testing:   No testing needed for planned surgery. If you develop any COVID-19 symptoms within 14 days of your procedure, please call [phone]. Ask to be connected to Einar Pheasant (Nurse for Dr. Daphine Deutscher)..     Anyone can have mild to severe symptoms. People with these symptoms may have COVID-19:    Fever or chills  Cough  Shortness of breath or difficulty breathing  Fatigue  Muscle or body aches  Headache  New loss of taste or smell  Sore throat  Congestion or runny nose  Nausea or vomiting  Diarrhea    Eating and drinking: You must be on a clear liquid beginning the day before to surgery. Beginning at 11pm the night prior to surgery, please limit your fluid intake to only sips or small amounts (1-2oz) of water and clear liquids. ALL CLEAR LIQUIDS AND WATER must be stopped 2 hours before you need to arrive to the hospital the day of your surgery. (Be aware, you can take a small sip of water with medications the morning of surgery, if necessary.)    Bowel Prep: Full bowel prep (Pre-Surgical Kits: A pre-surgical kit will be delivered directly to your home address. There will be instructions along with products you will need to prepare for surgery. Please open this kit, read the instructions upon it's arrival to your home. If you have not received at least 1.5 week in advance to your surgery, please call the [employer] with [facility] at Phone number [phone] or notify your physician's nurse.)    Hygiene:  Shower the night before and the morning of your procedure with Hibiclens (wipes will be delivered in your pre-surgical kit).    Personal Items:    Do not wear makeup, fingernail polish, lotion, deodorant, or perfume  Remove all metal jewelry and piercings.   Bring a Retail buyer for your glasses, contacts, hearing aids, dentures, etc.     Thing to Bring:    Personal identification (ID)   Insurance card    Medications:    You will be given medication instructions by the Pre-Operative Assessment Clinic [facility]). If you have NOT been scheduled a Pre-Operative Assessment appointment, you will receive a phone call to be ?triaged? by the anesthesia team. Please  be sure to speak with the anesthesia team or attend your [facility] appointment if setup for you. If you do not, this may result in cancellation or delay of your surgery.  If you did not receive medication instructions, or have questions about any of your current medications, as it relates to the day of your surgery, please call [phone].    Transportation/Visitor Policy:      During the COVID-19 crisis, due to the size of the waiting room, only 1 visitor is allowed in the designated perioperative waiting room. No visitors are allowed in pre/post patient care areas until a patient is transferred to a patient room. If you would like more than 1 visitor to be at the hospital during surgery, please be aware you are only allowed a maximum of 2 total visitors and please know your visitors will be asked to find a place to wait in the hospital lobby or cafeteria and will be notified by the nurse liaison team, when you are out of surgery.    You are to be admitted into the hospital following surgery. If you are in a private room following surgery, two visitors will be allowed to see you at the same time. Only a maximum of 2 different people can see you per day. A visitor will be allowed to stay the night with you once you have been admitted into a hospital room. When you are ready to be discharged, if no visitor is present, they will call your friend/family to notify them when to pick you up from the hospital.    Exceptions include:     No visitors allowed for patients with active COVID-19 infections.  No visitors under the age of 20 are allowed for patients admitted to the hospital.  Adult inpatients in semiprivate rooms may only have 2 visitor at a time, but may only have up to 2 different visitors in a single day. Due to space limitation, if you are in a shared room with another patient, this is to be sure that the other room occupant is allowed to have a visitor as well.   All visitors will be screened upon entry and must:     Wear a mask at all times until further notice.  Maintain physical distancing of at least six feet from others.  Wash hands or use hand sanitizer when entering or exiting patient rooms.     Any visitors who have a fever or other cold- or flu-like symptoms will not be allowed in [facility] facilities. We will continue to evaluate our recommendations and keep you updated on revisions based on public health guidance.       FMLA:   If you have any FMLA or Short Term Disability Paperwork needs, you may have these faxed to [phone]- Attention to Einar Pheasant, RN. Be sure your name and birthday are on the forms, along with a return fax number for this office to return them to once completed. Please allow up to 7-10 business days for completion of paperwork.     Billing Questions:   A member of the business office will verify your medical benefits with your insurance company prior to your surgery. If you have questions about understanding the cost of your care please call the Financial Customer Service at [phone].     You have been enrolled in a special program called Enhanced Recover After Surgery. This Program uses interventions that have been shown to reduce complications like blood clots,  pneumonia and infection. It also helps to get you home sooner. Key points are to do the presurgical exercises and walking program (which you will ready about below), drink your nutrition shakes before and after surgery as instructed, get up and walk as much as possible after surgery and follow your hospital nurse's instructions about bathing and gum chewing after surgery.    Presurgical Kits:   You will be ordered a presurgical kit. This Presurgical Kit will include products designed to prepare you for surgery and assist in your recovery.   If pre-operative antibiotics are indicated for your surgery, these will be included within the kit. There is a small fee for antibiotics included in this kit. Your insurance should help cover the cost of these antibiotics, but whatever is not covered by your insurance, will be your responsibility to pay. Payment method for these antibiotics must be on file before the kit will be shipped to your home. If you receive a call from the [employer], it is likely a they need to enter a payment method in your account before they can ship this kit.)  ** If you have not received this kit and it is 1.5 weeks prior to the date of your procedure, please call the [facility] at phone number [phone] to inquire about the status of this kit.    PreSurgical Dietary Consultation:   You may receive a call from a dietician prior to surgery. They may schedule a telephone call with you to discuss pre-surgical nutrition. Be aware, not every patient will receive a call.     Enhanced Recovery After Surgery Education and Ostomy Videos  ERAS overview (colorectal only) Click here to be routed to video or visit [email or web address]  2. Home exercises before your surgery: Click here or visit [email or web address]  3. Exercises before your surgery #2:  Click here or visit [email or web address]  4. Your epidural catheter after surgery: Click here or visit [email or web address]  5. What is an epidural?: Click here or visit [email or web address]  6. How to get out of bed after your surgery: Click here  or visit [email or web address]  7. Activity after your surgery:  Click here or visit [email or web address]  8. What is an ostomy: Click here or visit [email or web address]  9. Choosing your ostomy bag: Click Here to be routed to video or visit [email or web address]  10. Emptying your ostomy pouch: Click here or visit [email or web address]  11. Bathing with your stoma: Click here or visit [email or web address]  12. Managing ostomy complications: Click here or visit [email or web address]    Before Surgery Walking Program:  Your healthcare team would like you to participate in a walking program prior to your upcoming abdominal surgery.   This will help to increase your endurance and strength before undergoing surgery.   It is recommended that you make time to walk 5 days out of every week.     Beginner Program  Week 1: Begin with a 15-minute walk   5 minute warm up, 5 minute brisk walk, 5 minute cool down   Walk at a light to moderate intensity. You should be able to talk when walking  Add an additional 5 minutes or more every week as able to tolerate   Week 2: 20 minutes   5 minute warm up, 10 minute walk, 5 minute cool down   Week 3: 25 minutes   5 minute warm up, 10 minute walk, 5 minute cool down  Week 4:30 minutes    5 minute warm  up, 15 minute walk, 5 minute cool down   You may break you walks up throughout the day if necessary.   Remember to always rest as necessary and you should discontinue walking if you experience increased pain or dizziness.  Where to walk: You can start with your neighborhood, a park, track, mall, living room, or gym.     **If you have peripheral neuropathy or are unable to walk, a stationary bike may be more appropropriate. Patients undergoing chemotherapy or radiation may have lower tolerance to exercise. A walking program is still recommended however you may want to lower frequency and intensity of the program      Your healthcare team would like you to participate in a walking program prior to your upcoming abdominal surgery.   This will help to increase your endurance and strength before undergoing surgery.   It is recommended that you make time to walk 5 days out of very week.     Advanced Program  Week 1: Begin with a 30-minute walk   5 minute warm up, 20 minute walk, 5 minute cool down   Walk at a brisk or vigorous speed if able  Add an additional 10 or more minutes every week as able to tolerate   Week 2: 40 minute walk   5 minute warm up, 30 minute walk, 5 minute cool down  Week 3: 50 minute walk   5 minute warm up, 40 minute walk, 5 minute cool down  Week 4: 60 minute walk    5 minute warm up, 40 minute walk, 5 minute cool down  You may break you walks up multiple times throughout the day if needed.   Remember to always rest as necessary you should discontinue walking if you experience increased pain or dizziness.  Target is 14-17 RPE (rate of perceived exertion)      Where to walk: You can start with your neighborhood, a park, track, mall, living room, or gym.       Abdominal Surgery Prehab Exercises: Lower Extremity Strengthening  Complete each exercise 5 times, then increase your repetitions as tolerated.   SIT TO STAND     Start seated in a chair.  Without using your arms, slowly rise to full standing position.     You can use your arms if you are unable to complete this exercise without them.  Slowly return to sitting.  SIDE KICK     Hold onto table, counter or sturdy chair for balance.  Keep knee straight, toes pointed forward, move one leg outward.  Return to start position.  Repeat with other leg.  BRIDGING     Lie on your back with your knees bent and feet on the bed.  Squeeze your buttocks muscles and slowly lift your hips off the bed to create a ?bridge? with your body.  Hold for 3 seconds and then lower yourself back to bed.  **Please refer to the ?Getting into and out of bed after abdominal surgery? handout for further education.            Back Safety: Getting Into and Out of Bed  Safety Tip: After you stand up, wait a moment before walking to be sure you?re not dizzy.   Good posture protects your back when you sit, stand, and walk. It is also important while getting into and out of bed. Follow the steps below to get out of bed. Reverse them to get into bed.     Roll [employer]  Your Side   1. Roll Onto Your Side  Bend your knees up and keep them together.  Reach your opposite arm across your body towards the direction of the roll.  Roll your shoulders and hips at the same time, like a log, to avoid twisting.       Raise Your Body   2. Raise Your Body  Place hands on the bed in front of you.  Bring your legs off the edge of the bed, as you push your upper body up with your arms.  Allow the weight of your legs to help you move.       Stand Up   3. Stand Up  Lean forward from your hip and roll onto the balls of your feet.  Flatten your stomach muscles to keep your back from arching.  Using your arm and leg muscles, push yourself to a standing position.    POST OPERATIVE INSTRUCTIONS FOR ABDOMINAL SURGERY    You will be undergoing an abdominal surgery. Here are a few things to expect after your surgery:    1) Pain is to be expected after surgery. You should have been given a prescription for pain medication. Use this to relieve your pain as needed. It is important that your pain is well controlled enough that you can move around as this will aid your recovery. Generally, pain medication works better if you do not wait until you are in severe pain before using it, as it takes 30-60 minutes for the medications to take effect. Please also remember that we cannot refill narcotic medications on nights or weekends, please try to allow for 3 day advanced request to refill pain medication, as it can take some time for the office to confirm refill authorization.       2) We want you to use over the counter pain medication to help control your pain as well. Please be aware that in these first few weeks, your pain will likely not be a 0/10, but to find an acceptable pain tolerance level and to make this your goal for pain control. Our goal is to wean you away from the use of prescription pain medication as soon as possible after surgery. We would like to get you to the point that the only thing you need for pain management after surgery is over the counter Tylenol for mild discomforts.    For pain after abdominal surgery:    Be sure to take 1000mg  dose of Tylenol (acetaminophen) every six hours. (Remember to set an alarm or timer to go off every 6 hours to remind you to take your next dose).  If you have ever been informed by another provider not to take Tylenol, please notify your surgeon's office/nurse to discuss alternative options.    You can take your prescription pain medication (Tramadol/Oxycodone/Hydrocodone) at the same time as your Tylenol. (Please set a timer for your next prescribed interval (4 hours, 6 hours, 8 hours, 12 hours), to re-assess your pain. If you feel you need to take another dose of (Tramadol/Oxycodone/Hydrocodone) you can. HOWEVER, if your pain is well controlled with the over the counter Tylenol, do NOT take.   It can be hard to wake yourself up at night, but if any of these alarms/timers fall in the middle of the night, we recommend waking to assess your pain during the overnight hours. Pain can get away from you quickly while you sleep, and we do not want you to wake up with  uncontrolled pain in the mornings. It is better to re-dose your medication in the middle of the night, if needed, rather than waking up and requiring higher doses due to increased pain.    Use a heating pad to the abdomen/pelvis for 20-30 minutes at a time to help with discomfort.    Prescription pain medications can be constipating. Constipation can cause additional pain or recovery complications. We recommend, if you are not already doing so, to take a daily bowel regimen to help prevent constipation (see below).    Recommended Post Operative Bowel Regimen:  Docusate Sodium (50mg  tablets) - take 1 tablet twice daily  Miralax (1 dose) daily  **If you should go more than 2-3 days without a bowel movement, take a dose of Miralax every 2-3 hours until a bowel movement is initiated.   **If you should experience diarrhea, please stop taking Miralax. Only use when needed.    3) Clear yellow or fruit-punch colored fluid can build up under your incision. Sometimes this oozes out, or sometimes a large amount will come from the wound in a short period of time, and then slows down - this can be normal. If the fluid is thick and foul-smelling, you have redness spreading out from your wound, fever greater than 101.5 F, or worsening pain you should call your provider.    4) It is fine to shower after your surgery unless your provider has instructed you otherwise. Do not scrub the wounds. Do not apply an antibacterial ointment to the wound unless instructed otherwise. Baths or hot tubs should not be used until it is cleared by your provider at the first postoperative visit. If a portion of your wound is open, the dressing should be removed before showering. It is OK to get soap and water in the wound. After showering, pat dry and reapply the dressing.    5) Your diet after discharge should largely consist of low residue foods for at least 4-6 weeks after surgery.  No raw vegetables, fruit skins, or fibrous foods that require a lot of chewing (nuts, seeds, corns, popcorn).  We recommend eating slowly, chewing thoroughly, and eating small frequent meals throughout the day.  Protein supplements such as Boost or Ensure may also be beneficial.  Make sure you stay well hydrated.    6) Nausea can occur, and it is common to have less appetite or get full more quickly for several weeks after surgery. Frequent small meals work best. Try taking your pain medication with food to minimize any nausea from your pain medication. If you have vomiting or the nausea that is hard to control, call your provider.    7) It is normal for bowel movements to be irregular after surgery. However, at times diarrhea or constipation can be of concern. You may want to call you provider if you are having more than three diarrheal bowel movements for two days in a row, or have not had a bowel movement for three days.    8) It is normal for your temperature to be slightly higher than normal after surgery, or to feel cold or have hot flashes. If you have a multiple fevers over 101.5, please call your provider.    9) You should not lift more than 10 pounds (about a gallon of milk) for 6-8 weeks after surgery.  I would recommend you also not doing any strenuous/physical activities at least until your 1st postop appointment.  You should not drive while on narcotics pain medication.  10) You should resume all of your pre-surgery home medications as previously prescribed.  If any changes have been made to your medication regimen or to the doses of your medications, this will be noted on your hospital discharge paperwork.  If you have any questions about your pre-surgery medications or their doses, please contact your primary care provider.    11)  If you have an ileostomy, you should measure that output every day for the first 2 weeks after surgery until you are more familiar with it.  Record the output on a piece of paper and bring that with you to your first post-op appointment.  If you have ileostomy output >1000 mL for 2 days in a row, please call the office.  If you start to get symptoms of dehydration (nausea, vomiting, dizziness, lightheadedness, headache) you should call the office.  **Ostomy Supplies: if you are going home with a new ileostomy or colostomy, your home health nurse will help you arrange for ostomy supplies. If you do not have home health, or they do not assist you with arranging ostomy supply order prior to your discharge from home health, you may call [facility] at [phone]. They will send you a startup kit and set you up with an ostomy supply distributer.  **Ostomy Issues: If you are having issues with your ostomy/stoma, please call Penni Homans, RN at [phone] and she will help arrange an ostomy clinic appointment for you, if needed.    12) You will need to be seen by Dr. Daphine Deutscher or Deberah Pelton, NP, in 2-3 weeks after surgery. Please contact our clinic scheduler at [phone] if you do not have a follow-up appointment or have not heard from our clinic in 3 business day after discharge to schedule a follow-up appointment.  If you have any questions or concerns - PLEASE CALL [phone] during normal business hours (M-F 8:00AM-4PM), it is better to wait until the daytime as someone more familiar with your care will be able to help you.  However, if it is an emergency, the on-call surgeon will be able to give you good advice. You can reach this by calling the main hospital at [phone] and requesting someone from surgical oncology be paged.    If you have questions, please call [phone]. Ask to be connected to Einar Pheasant (Nurse for Dr. Daphine Deutscher).    If your needs are URGENT and it?s after hours   please call [phone] and have the on call provider with Surgical Oncology paged.

## 2021-05-11 ENCOUNTER — Encounter: Admit: 2021-05-11 | Discharge: 2021-05-11 | Payer: BC Managed Care – PPO

## 2021-05-11 DIAGNOSIS — B999 Unspecified infectious disease: Secondary | ICD-10-CM

## 2021-05-11 DIAGNOSIS — M1990 Unspecified osteoarthritis, unspecified site: Secondary | ICD-10-CM

## 2021-05-11 DIAGNOSIS — T8859XA Other complications of anesthesia, initial encounter: Secondary | ICD-10-CM

## 2021-05-11 DIAGNOSIS — M359 Systemic involvement of connective tissue, unspecified: Secondary | ICD-10-CM

## 2021-05-12 ENCOUNTER — Encounter: Admit: 2021-05-12 | Discharge: 2021-05-12 | Payer: BC Managed Care – PPO

## 2021-05-12 ENCOUNTER — Observation Stay: Admit: 2021-05-12 | Discharge: 2021-05-12 | Payer: BC Managed Care – PPO

## 2021-05-12 ENCOUNTER — Ambulatory Visit: Admit: 2021-05-12 | Discharge: 2021-05-12 | Payer: BC Managed Care – PPO

## 2021-05-12 ENCOUNTER — Emergency Department: Admit: 2021-05-12 | Discharge: 2021-05-12 | Payer: BC Managed Care – PPO

## 2021-05-12 DIAGNOSIS — M1990 Unspecified osteoarthritis, unspecified site: Secondary | ICD-10-CM

## 2021-05-12 DIAGNOSIS — M359 Systemic involvement of connective tissue, unspecified: Secondary | ICD-10-CM

## 2021-05-12 DIAGNOSIS — N390 Urinary tract infection, site not specified: Principal | ICD-10-CM

## 2021-05-12 DIAGNOSIS — T8859XA Other complications of anesthesia, initial encounter: Secondary | ICD-10-CM

## 2021-05-12 DIAGNOSIS — B999 Unspecified infectious disease: Secondary | ICD-10-CM

## 2021-05-12 DIAGNOSIS — R3989 Other symptoms and signs involving the genitourinary system: Secondary | ICD-10-CM

## 2021-05-12 DIAGNOSIS — K435 Parastomal hernia without obstruction or  gangrene: Secondary | ICD-10-CM

## 2021-05-12 LAB — CBC AND DIFF
ABSOLUTE BASO COUNT: 0 K/UL (ref 0–0.20)
ABSOLUTE EOS COUNT: 0.2 K/UL (ref 0–0.45)
ABSOLUTE LYMPH COUNT: 1.5 K/UL (ref >60)
ABSOLUTE MONO COUNT: 0.3 K/UL (ref 0–0.80)
BASOPHILS %: 1 % (ref 0–2)
MDW (MONOCYTE DISTRIBUTION WIDTH): 18 mg/dL — ABNORMAL LOW (ref <20.7)
NEUTROPHILS %: 56 % (ref 41–77)
WBC COUNT: 4.9 K/UL (ref 4.5–11.0)

## 2021-05-12 LAB — COMPREHENSIVE METABOLIC PANEL
ALK PHOSPHATASE: 51 U/L (ref 25–110)
AST: 26 U/L (ref 7–40)
CALCIUM: 9.6 mg/dL (ref 8.5–10.6)
CO2: 29 MMOL/L (ref 21–30)
SODIUM: 140 MMOL/L (ref 137–147)

## 2021-05-12 LAB — URINALYSIS DIPSTICK REFLEX TO CULTURE
GLUCOSE,UA: NEGATIVE g/dL (ref 6.0–8.0)
URINE ASCORBIC ACID, UA: POSITIVE K/UL — AB (ref 3–12)
URINE KETONE: NEGATIVE mg/dL (ref 0.3–1.2)

## 2021-05-12 LAB — MAGNESIUM: MAGNESIUM: 2.2 mg/dL (ref >60)

## 2021-05-12 LAB — COVID-19 (SARS-COV-2) PCR

## 2021-05-12 LAB — URINALYSIS MICROSCOPIC REFLEX TO CULTURE

## 2021-05-12 LAB — POC CREATININE, RAD: CREATININE, POC: 0.9 mg/dL (ref 0.4–1.00)

## 2021-05-12 LAB — POC LACTATE: LACTIC ACID POC: 1 MMOL/L (ref 0.5–2.0)

## 2021-05-12 MED ORDER — POLYETHYLENE GLYCOL 3350 17 GRAM PO PWPK
1 | Freq: Every day | ORAL | 0 refills | Status: AC | PRN
Start: 2021-05-12 — End: ?
  Administered 2021-05-13: 03:00:00 17 g via ORAL

## 2021-05-12 MED ORDER — ONDANSETRON 4 MG PO TBDI
4 mg | ORAL | 0 refills | Status: AC | PRN
Start: 2021-05-12 — End: ?

## 2021-05-12 MED ORDER — MELATONIN 5 MG PO TAB
5 mg | Freq: Every evening | ORAL | 0 refills | Status: AC | PRN
Start: 2021-05-12 — End: ?

## 2021-05-12 MED ORDER — PILOCARPINE HCL 5 MG PO TAB
5 mg | Freq: Four times a day (QID) | ORAL | 0 refills | Status: AC
Start: 2021-05-12 — End: ?
  Administered 2021-05-13 (×3): 5 mg via ORAL

## 2021-05-12 MED ORDER — ACETAMINOPHEN 325 MG PO TAB
650 mg | ORAL | 0 refills | Status: AC | PRN
Start: 2021-05-12 — End: ?
  Administered 2021-05-13 (×2): 650 mg via ORAL

## 2021-05-12 MED ORDER — HEPARIN, PORCINE (PF) 5,000 UNIT/0.5 ML IJ SYRG
5000 [IU] | SUBCUTANEOUS | 0 refills | Status: AC
Start: 2021-05-12 — End: ?
  Administered 2021-05-13 (×3): 5000 [IU] via SUBCUTANEOUS

## 2021-05-12 MED ORDER — ESTRADIOL 0.01 % (0.1 MG/GRAM) VA CREA
1 g | VAGINAL | 0 refills | Status: AC
Start: 2021-05-12 — End: ?

## 2021-05-12 MED ORDER — SENNOSIDES-DOCUSATE SODIUM 8.6-50 MG PO TAB
1 | Freq: Every day | ORAL | 0 refills | Status: AC | PRN
Start: 2021-05-12 — End: ?
  Administered 2021-05-13: 03:00:00 1 via ORAL

## 2021-05-12 MED ORDER — IOHEXOL 350 MG IODINE/ML IV SOLN
100 mL | Freq: Once | INTRAVENOUS | 0 refills | Status: CP
Start: 2021-05-12 — End: ?
  Administered 2021-05-12: 21:00:00 100 mL via INTRAVENOUS

## 2021-05-12 MED ORDER — TOBRAMYCIN IVPB (CONVENTIONAL)
320 mg | Freq: Once | INTRAVENOUS | 0 refills | Status: CP
Start: 2021-05-12 — End: ?
  Administered 2021-05-12 (×2): 320 mg via INTRAVENOUS

## 2021-05-12 MED ORDER — IMS MIXTURE TEMPLATE
75 mg | Freq: Every evening | ORAL | 0 refills | Status: AC
Start: 2021-05-12 — End: ?
  Administered 2021-05-13 (×2): 75 mg via ORAL

## 2021-05-12 MED ORDER — PHENAZOPYRIDINE 200 MG PO TAB
200 mg | Freq: Three times a day (TID) | ORAL | 0 refills | Status: AC | PRN
Start: 2021-05-12 — End: ?

## 2021-05-12 MED ORDER — SODIUM CHLORIDE 0.9 % IJ SOLN
50 mL | Freq: Once | INTRAVENOUS | 0 refills | Status: CP
Start: 2021-05-12 — End: ?
  Administered 2021-05-12: 21:00:00 50 mL via INTRAVENOUS

## 2021-05-12 MED ORDER — ONDANSETRON HCL (PF) 4 MG/2 ML IJ SOLN
4 mg | INTRAVENOUS | 0 refills | Status: AC | PRN
Start: 2021-05-12 — End: ?

## 2021-05-12 MED ORDER — NYSTATIN 100,000 UNIT/GRAM TP CREA
Freq: Two times a day (BID) | TOPICAL | 0 refills | Status: AC
Start: 2021-05-12 — End: ?
  Administered 2021-05-13: 03:00:00 via TOPICAL

## 2021-05-12 MED ORDER — HYDROCORTISONE 2.5 % TP CREA
Freq: Two times a day (BID) | TOPICAL | 0 refills | Status: AC
Start: 2021-05-12 — End: ?
  Administered 2021-05-13: 03:00:00 via TOPICAL

## 2021-05-12 NOTE — ED Provider Notes
Janice Gregory is a 61 y.o. female.    Chief Complaint:  Chief Complaint   Patient presents with   ? Abdominal pain     Rectal pain   ? Urinary Pain       History of Present Illness:  Janice Gregory is a 61 y.o. female, with a history of sicca syndrome, possible Sjogren's, chronic constipation, fibromyalgia, and multiple pelvic surgeries to ultimately include end colostomy creation on 12/15/2019 who presents to the emergency department for abdominal pain and urinary pain. Per chart review, patient was being seen in the ob gyn clinic this morning for ongoing UTI management. She had been referred to infectious disease for IV antibiotics after her urine culture came back showing Pseudomonas aeruginosa  50-100 K with multiple drug resistances but is unable to get in with them until 05/19/2021. Patient was ultimately referred to the ED. Patient endorses urinary pain, as well as, rectal pressure and mucus output. Denies fevers, chills, nausea, or vomiting. Denies changes in ostomy output. Otherwise, she mentions being scheduled for parastomal hernia repair on 06/20/2021.      History provided by:  Patient and medical records  Language interpreter used: No    Abdominal pain  Associated symptoms: dysuria    Associated symptoms: no chest pain, no chills, no constipation, no cough, no diarrhea, no fever, no nausea, no shortness of breath, no sore throat and no vomiting    Urinary Pain  Associated symptoms: abdominal pain    Associated symptoms: no fever, no nausea and no vomiting        Review of Systems:  Review of Systems   Constitutional: Negative for chills and fever.   HENT: Negative for sinus pressure and sore throat.    Eyes: Negative for pain, discharge and visual disturbance.   Respiratory: Negative for cough and shortness of breath.    Cardiovascular: Negative for chest pain and palpitations.   Gastrointestinal: Positive for abdominal pain. Negative for constipation, diarrhea, nausea and vomiting.        Rectal pressure and mucus output.   Genitourinary: Positive for dysuria. Negative for frequency.   Musculoskeletal: Negative for arthralgias and back pain.   Skin: Negative for rash and wound.   Neurological: Negative for dizziness and headaches.   Psychiatric/Behavioral: Negative for agitation and confusion.       Allergies:  Patient has no known allergies.    Past Medical History:  Medical History:   Diagnosis Date   ? Arthritis    ? Complication of anesthesia    ? Connective tissue disease (HCC)    ? Infection        Past Surgical History:  Surgical History:   Procedure Laterality Date   ? ANORECTAL MANOMETRY N/A 11/11/2019    Performed by Eliott Nine, MD at [facility]   ? LAPAROSCOPIC COLOSTOMY N/A 12/15/2019    Performed by Benetta Spar, MD at [facility] OR   ? ARTHROPLASTY     ? FOOT SURGERY     ? HERNIA REPAIR     ? HX APPENDECTOMY     ? HX CHOLECYSTECTOMY     ? HX HEMORRHOIDECTOMY     ? HX HYSTERECTOMY     ? HX JOINT REPLACEMENT     ? HYSTERECTOMY     ? KNEE SURGERY         Pertinent medical/surgical history reviewed  Medical History:   Diagnosis Date   ? Arthritis    ? Complication of anesthesia    ?  Connective tissue disease (HCC)    ? Infection      Surgical History:   Procedure Laterality Date   ? ANORECTAL MANOMETRY N/A 11/11/2019    Performed by Eliott Nine, MD at [facility] ENDO   ? LAPAROSCOPIC COLOSTOMY N/A 12/15/2019    Performed by Benetta Spar, MD at [facility] OR   ? ARTHROPLASTY     ? FOOT SURGERY     ? HERNIA REPAIR     ? HX APPENDECTOMY     ? HX CHOLECYSTECTOMY     ? HX HEMORRHOIDECTOMY     ? HX HYSTERECTOMY     ? HX JOINT REPLACEMENT     ? HYSTERECTOMY     ? KNEE SURGERY         Social History:  Social History     Tobacco Use   ? Smoking status: Former     Types: Cigarettes     Quit date: 05/08/1977     Years since quitting: 44.0   ? Smokeless tobacco: Never   Vaping Use   ? Vaping Use: Never used   Substance Use Topics   ? Alcohol use: Yes     Comment: rarely   ? Drug use: Never     Social History     Substance and Sexual Activity   Drug Use Never             Family History:  Family History   Problem Relation Age of Onset   ? Osteoporosis Mother    ? Arthritis Mother    ? Heart Attack Father    ? Osteoporosis Sibling    ? Arthritis Sibling    ? Scoliosis Sibling        Vitals:  ED Vitals    Date and Time T BP P RR SPO2P SPO2 User   05/12/21 1530 -- 146/94 -- -- 72 99 % BL   05/12/21 1512 -- 139/83 -- -- 70 100 % BL   05/12/21 1200 -- 147/92 -- -- 70 97 % BL   05/12/21 1131 -- 160/94 -- -- 82 98 % BL   05/12/21 1119 -- 123/86 -- 20 PER MINUTE 78 99 % BL   05/12/21 0908 36.6 ?C (97.9 ?F) 158/85 83 -- -- 99 % HK          Physical Exam:  Physical Exam  Vitals and nursing note reviewed.   Constitutional:       General: She is not in acute distress.     Appearance: Normal appearance. She is normal weight. She is not ill-appearing.   HENT:      Head: Normocephalic and atraumatic.      Right Ear: External ear normal.      Left Ear: External ear normal.   Eyes:      General:         Right eye: No discharge.         Left eye: No discharge.      Extraocular Movements: Extraocular movements intact.      Conjunctiva/sclera: Conjunctivae normal.   Cardiovascular:      Rate and Rhythm: Normal rate and regular rhythm.      Pulses: Normal pulses.      Heart sounds: Normal heart sounds.   Pulmonary:      Effort: Pulmonary effort is normal.      Breath sounds: Normal breath sounds. No wheezing.   Abdominal:      General: Abdomen is flat. Bowel sounds are normal.  There is no distension.      Palpations: Abdomen is soft.      Tenderness: There is no abdominal tenderness.      Comments: Colostomy present.   Musculoskeletal:         General: No swelling or tenderness. Normal range of motion.      Cervical back: Normal range of motion and neck supple. No muscular tenderness.   Skin:     General: Skin is warm and dry.   Neurological:      General: No focal deficit present.      Mental Status: She is alert and oriented to person, place, and time. Mental status is at baseline.   Psychiatric:         Mood and Affect: Mood normal.         Behavior: Behavior normal.         Thought Content: Thought content normal.         Judgment: Judgment normal.         Laboratory Results:  Labs Reviewed   URINALYSIS DIPSTICK REFLEX TO CULTURE - Abnormal       Result Value Ref Range Status    Color,UA YELLOW   Final    Turbidity,UA CLEAR  CLEAR-CLEAR Final    Specific Gravity-Urine 1.005  1.005 - 1.030 Final    pH,UA 7.0  5.0 - 8.0 Final    Protein,UA NEG  NEG-NEG Final    Glucose,UA NEG  NEG-NEG Final    Ketones,UA NEG  NEG-NEG Final    Bilirubin,UA NEG  NEG-NEG Final    Blood,UA NEG  NEG-NEG Final    Urobilinogen,UA NORMAL  NORM-NORMAL Final    Nitrite,UA NEG  NEG-NEG Final    Leukocytes,UA NEG  NEG-NEG Final    Urine Ascorbic Acid, UA POS (*) NEG-NEG Final   CULTURE-BLOOD W/SENSITIVITY   CULTURE-BLOOD W/SENSITIVITY   CBC AND DIFF    White Blood Cells 4.9  4.5 - 11.0 K/UL Final    RBC 4.35  4.0 - 5.0 M/UL Final    Hemoglobin 14.4  12.0 - 15.0 GM/DL Final    Hematocrit 16.1  36 - 45 % Final    MCV 93.2  80 - 100 FL Final    MCH 33.0  26 - 34 PG Final    MCHC 35.4  32.0 - 36.0 G/DL Final    RDW 09.6  11 - 15 % Final    Platelet Count 210  150 - 400 K/UL Final    MPV 8.0  7 - 11 FL Final    Neutrophils 56  41 - 77 % Final    Lymphocytes 31  24 - 44 % Final    Monocytes 7  4 - 12 % Final    Eosinophils 5  0 - 5 % Final    Basophils 1  0 - 2 % Final    Absolute Neutrophil Count 2.72  1.8 - 7.0 K/UL Final    Absolute Lymph Count 1.53  1.0 - 4.8 K/UL Final    Absolute Monocyte Count 0.34  0 - 0.80 K/UL Final    Absolute Eosinophil Count 0.26  0 - 0.45 K/UL Final    Absolute Basophil Count 0.06  0 - 0.20 K/UL Final    MDW (Monocyte Distribution Width) 18.1  <20.7 Final   COMPREHENSIVE METABOLIC PANEL    Sodium 140  045 - 147 MMOL/L Final    Potassium 4.0  3.5 - 5.1 MMOL/L Final  Chloride 102  98 - 110 MMOL/L Final    Glucose 86  70 - 100 MG/DL Final    Blood Urea Nitrogen 12  7 - 25 MG/DL Final    Creatinine 1.61  0.4 - 1.00 MG/DL Final    Calcium 9.6  8.5 - 10.6 MG/DL Final    Total Protein 6.8  6.0 - 8.0 G/DL Final    Total Bilirubin 0.9  0.3 - 1.2 MG/DL Final    Albumin 4.3  3.5 - 5.0 G/DL Final    Alk Phosphatase 51  25 - 110 U/L Final    AST (SGOT) 26  7 - 40 U/L Final    CO2 29  21 - 30 MMOL/L Final    ALT (SGPT) 22  7 - 56 U/L Final    Anion Gap 9  3 - 12 Final    eGFR >60  >60 mL/min Final   MAGNESIUM    Magnesium 2.2  1.6 - 2.6 mg/dL Final   URINALYSIS MICROSCOPIC REFLEX TO CULTURE    WBCs,UA 0-2  0 - 2 /HPF Final    RBCs,UA NONE  0 - 3 /HPF Final    Comment,UA     Final    Value: Criteria for reflex to culture are WBC>10, Positive Nitrite, and/or >=+1   leukocytes. If quantity is not sufficient, an addendum will follow.     POC CREATININE, RAD    Creatinine, POC 0.9  0.4 - 1.00 MG/DL Final   POC LACTATE    LACTIC ACID POC 1.0  0.5 - 2.0 MMOL/L Final   URINE TIGER TOP TUBE   URINE CLEAR TOP TUBE   EXTRA URINE GRAY TOP   UA GREY TOP TUBE   CLEAR TOP EXTRA URINE TUBE   POC CREATININE   POC LACTATE   POC LACTATE          Radiology Interpretation:    CT ABD/PELV W CONTRAST   Final Result         Status post distal segmental colonic resection with creation of a left lower quadrant end colostomy and Hartmann pouch. There is no bowel obstruction or acute inflammatory process within the abdomen or pelvis.          Finalized by Prince Rome, D.O. on 05/12/2021 3:17 PM. Dictated by Prince Rome, D.O. on 05/12/2021 3:08 PM.               EKG:      Medical Decision Making:  Patient is a 61 y.o. female presenting with the chief complaint of urinary pain.  Pt seen and evaluated by resident and attending at bedside.  Previous records reviewed.   Patient's initial vitals reviewed.   PIV placed, labs drawn, patient on monitor.  Course  -Physical exam significant for the findings above  -Labs and imaging ordered for further evaluation of patient symptoms  - Previous outside urine cultures reviewed, pharmacy consulted, given that the patient is continuing to experience UTI symptoms and has culture proven Pseudomonas from December 30 patient will be given tobramycin per culture results  -CT abdomen pelvis independently interpreted as unremarkable  - CBC, chemistry, and urinalysis obtained and appear unremarkable  - Given that the patient continues to experience UTI symptoms and has culture proven multidrug-resistant organism patient will be admitted to internal medicine at this time for further evaluation management of UTI    Disposition: Admission.     Problem list:  UTI  Colostomy present  Recurrent UTIs  Connective tissue  disease      Facility Administered Meds:  Medications   tobramycin (NEBCIN) 320 mg in sodium chloride 0.9% (NS) 100 mL IVPB (CONVENTIONAL) (320 mg Intravenous Given - New Bag 05/12/21 1600)   iohexoL (OMNIPAQUE-350) 350 mg/mL injection 100 mL (100 mL Intravenous Given 05/12/21 1448)   sodium chloride PF 0.9% injection 50 mL (50 mL Intravenous Given 05/12/21 1447)       Clinical Impression:  Clinical Impression   Urinary tract infection without hematuria, site unspecified       Disposition/Follow up  ED Disposition     ED Disposition   Admit           No follow-up provider specified.    Medications:  New Prescriptions    No medications on file       Procedure Notes:  Procedures    Attestation / Supervision:  I, Deanne Coffer, am scribing for and in the presence of Scot Dock, DO.      Deanne Coffer    I, Scot Dock, DO, personally performed the services described in this documentation as scribed and it is both accurate and complete.    Scot Dock, DO

## 2021-05-12 NOTE — Progress Notes
Pertinent Review of Systems:  - General ROS:  Denies recent weight change above 10 pounds, POS malaise  - HEENT ROS:  Denies blurring of vision.  Denies double vision,  Denies glaucoma,  Denies dry eyes  - Cardiovascular ROS:  Denies chest pain.  Denies palpitations currently.  Denies orthopnea  - Respiratory ROS:  Denies shortness of breath,  Denies cough,  Denies wheezing currently  - Gastrointestinal ROS:  Denies constipation,  Denies gastric reflux,  Denies blood in stool,  Denies diarrhea,  Denies Irritable bowel syndrome,  Denies nausea,  Denies vomiting, POS abdominal pain  - Endocrine ROS: POS polydipsia,  Denies excessive sweating.  Denies hot flashes.  Denies Thyroid disease  - Hematological and Lymphatic ROS:  Denies easy bruisability,  Denies current anemia,  Denies adenopathy in the inguinal area  - Neurological ROS:  Denies syncope,  Denies numbness in lower extremities,  Denies neuropathy.  Denies shooting pain down the legs,  Denies shooting pain in the lower back,  Denies low back pain  - Musculoskeletal ROS:  Denies Joint pain.  Denies Edema in lower extremities.  Denies fibromyalgia  - Psychological ROS:  Denies depression,  Denies anxiety,  Denies thoughts of suicide,  Denies thoughts of homicide,  Denies recent seizure,  Denies syncope   - Dermatological ROS:  Denies eczema.  Denies skin rashes in the groin area and other sites

## 2021-05-12 NOTE — H&P (View-Only)
Admission History and Physical Examination      Name:  Janice Gregory                                             MRN:  [id number]   Admission Date:  05/12/2021                     Assessment/Plan:    Principal Problem:    Complicated UTI (urinary tract infection)    61 year old female with past history of Sjogren's syndrome, sicca syndrome, fibromyalgia with chronic constipation, multiple pelvic surgeries s/p colostomy 8/21 and recurrent UTIs presented to ED with recent UA showing MDR Pseudomonas with symptoms of UTI including dysuria, frequency and suprapubic pain.    Suspected complicated UTI  Chronic cystitis  Chronic bladder discomfort   -UA on 05/12/2021 showing MDR Pseudomonas aeruginosa along with 2+ pyuria but negative nitrites  -Patient does have chronic dysuria and suprapubic pain for which she follows with urology and OB/GYN  -Patient gets bladder installations, vaginal estrogen therapy, cranberry extract for relief with minimal improvement.  -Currently taking Pyridium with no improvement  -Recently on ciprofloxacin at home  -Patient received tobramycin in ED  -CT abdomen showing no bowel obstruction or acute inflammatory process  -Consult ID team for further recommendation including need for antibiotics.  Patient is afebrile with no signs of sepsis at this time     Sjogren's syndrome  Sicca syndrome  -Resume pilocarpine and eyedrops as needed    Fibromyalgia  -As needed Tylenol    Parastomal hernia  -patient scheduled for surgery outpatient    FEN  -IVF None  -Replace K & Mg to keep them >4 & >2 respectively   -Diet: Cardiac    DVT ppx : Heparin sc    Full code     Total Time Today was 75 minutes in the following activities: Preparing to see the patient, Obtaining and/or reviewing separately obtained history, Performing a medically appropriate examination and/or evaluation, Counseling and educating the patient/family/caregiver, Ordering medications, tests, or procedures, Referring and communication with other health care professionals (when not separately reported), Documenting clinical information in the electronic or other health record, Independently interpreting results (not separately reported) and communicating results to the patient/family/caregiver and Care coordination (not separately reported)      __________________________________________________________________________________  Primary Care Physician: Nicole Cella  Verified    Chief Complaint:  Dysuria     History of Present Illness: Janice Gregory is a 61 y.o. female  with past history of Sjogren's syndrome, sicca syndrome, fibromyalgia with chronic constipation, multiple pelvic surgeries s/p colostomy 8/21 and recurrent UTIs presented to ED with recent UA showing MDR Pseudomonas with symptoms of UTI including dysuria, frequency and suprapubic pain.    Patient seen in ED with no family at the bedside.  She reports that she was asked to come to ED for IV antibiotics with her recent urine culture showing MDR Pseudomonas.  Patient does report having increased bladder spasms with suprapubic pain, dysuria, frequency and urgency.  Reports she has been dealing with chronic bladder problems and recurrent UTIs for at least 2 years.  Patient also reports she recently finished ciprofloxacin with no improvement in her symptoms.  Denies having any subjective fevers, chills, nausea/vomiting, flank pain, hematuria or any other signs of infection.     Reports she takes Pyridium  with no improvement at home.    She also reports having mucus coming from her rectum like it used to when she initially had her colostomy bag.  She also reports having rectal pain whenever she strains to urinate.     Medical History:   Diagnosis Date   ? Arthritis    ? Complication of anesthesia    ? Connective tissue disease (HCC)    ? Infection      Surgical History:   Procedure Laterality Date   ? ANORECTAL MANOMETRY N/A 11/11/2019    Performed by Eliott Nine, MD at [facility] ENDO   ? LAPAROSCOPIC COLOSTOMY N/A 12/15/2019    Performed by Benetta Spar, MD at [facility] OR   ? ARTHROPLASTY     ? FOOT SURGERY     ? HERNIA REPAIR     ? HX APPENDECTOMY     ? HX CHOLECYSTECTOMY     ? HX HEMORRHOIDECTOMY     ? HX HYSTERECTOMY     ? HX JOINT REPLACEMENT     ? HYSTERECTOMY     ? KNEE SURGERY       Family history reviewed; non-contributory  Social History     Tobacco Use   ? Smoking status: Former     Types: Cigarettes     Quit date: 05/08/1977     Years since quitting: 44.0   ? Smokeless tobacco: Never   Vaping Use   ? Vaping Use: Never used   Substance and Sexual Activity   ? Alcohol use: Yes     Comment: rarely   ? Drug use: Never   ? Sexual activity: Not Currently     Partners: Male      Immunizations (includes history and patient reported):   Immunization History   Administered Date(s) Administered   ? COVID-19 (MODERNA), mRNA vacc, 100 mcg/0.5 mL (PF) 08/14/2019, 09/11/2019   ? COVID-19 (PFIZER tris-sucrose) mRNA vacc, 30 mcg/0.3 mL (PF) 05/31/2020   ? COVID-19 Bivalent Booster (31YR+)(PFIZER), mRNA vacc, 37mcg/0.3mL 02/14/2021           Allergies:  Patient has no known allergies.    Medications:  Current Facility-Administered Medications   Medication   ? lidocaine hcl (XYLOCAINE) 2 % jelly (URO-JET)   ? tobramycin (NEBCIN) 320 mg in sodium chloride 0.9% (NS) 100 mL IVPB (CONVENTIONAL)     Current Outpatient Medications   Medication Sig   ? acetaminophen (TYLENOL) 325 mg tablet Take three tablets by mouth every 8 hours as needed for Pain.   ? amitriptyline (ELAVIL) 25 mg tablet Take 1 tablet by mouth daily at bedtime with 50 mg tablet for a total of 75 mg daily at bedtime.   ? amitriptyline (ELAVIL) 50 mg tablet Take one tablet by mouth at bedtime daily.   ? ascorbic acid (VITAMIN C PO) Take  by mouth.   ? ascorbic acid 1,000 mg tablet Take 2 tablets by mouth daily.   ? Black Cohosh 540 mg cap Take 2 capsules by mouth at bedtime daily.   ? ciprofloxacin (CIPRO) 500 mg tablet Please take 500mg  at 11PM the night before surgery.  Indications: ERAS pre-surgical preparation   ? COLLAGEN MISC Take 2 capsules by mouth daily.   ? Cranberry Extract 500 mg cap Take 1 capsule by mouth daily.   ? d-mannose 500 mg cap Take 1,000 mg by mouth twice daily.   ? docosahexaenoic acid/epa (FISH OIL PO) Take  by mouth.   ? docusate sodium (COLACE PO) Take  by mouth.   ? estradioL (ESTRACE) 0.01 % (0.1 mg/g) vaginal cream Insert or Apply one g to vaginal area three times weekly.   ? hydrocortisone (HYTONE) 2.5 % topical cream Apply  topically to affected area twice daily.   ? ibuprofen (ADVIL) 200 mg tablet Take 200 mg by mouth every 6 hours. Take with food.   ? Lactobac no.41/Bifidobact no.7 (PROBIOTIC-10 PO) Take 1 capsule by mouth daily. 20 Billion    ? lifitegrast (XIIDRA) 5 % ophthalmic drop Apply 1 drop to both eyes every 12 hours.   ? methen-m.blue-s.phos-phsal-hyo (URIBEL) 118-10-40.8-36 mg capsule Take one capsule by mouth four times daily. as needed for bladder pain   ? metroNIDAZOLE (FLAGYL) 500 mg tablet Please take at the following times:   1pm: Take 2 tablets of Flagyl (500mg  tablets)   3pm: Take 2 tablets of Flagyl (500mg  tablets)   11pm: Take 2 tablets of Flagyl (500mg  tablets)  *Please try to take all the antibiotics.  Indications: ERAS pre-surgical preparation   ? mv-mn-folic ac-vit K-herb 289 800-100 mcg tab Take  by mouth.   ? nystatin (MYCOSTATIN) 100,000 unit/g topical cream Apply  topically to affected area twice daily.   ? phenazopyridine (PYRIDIUM) 200 mg tablet Take one tablet by mouth three times daily as needed for Pain. Take after meals for up to 2 days.   ? pilocarpine HCL (SALAGEN) 5 mg tablet Take 5 mg by mouth four times daily.   ? polyethylene glycol 3350 (MIRALAX) 17 g packet Take 17 g by mouth daily.   ? safflower oil/linoleic acid,co (CLA PO) Take 1,300 mg by mouth twice daily.   ? simethicone (GAS-X PO) Take  by mouth.   ? sodium chloride (MURO-5) 5 % ophthalmic solution Place 1 drop into or around eye(s) every 3 hours.   ? valACYclovir (VALTREX) 1 gram tablet      Review of Systems:  A 14 point review of systems was negative except for: Constitutional: positive for fatigue  Gastrointestinal: positive for constipation and abdominal pain  Genitourinary: positive for frequency, dysuria, hesitancy and hot flashes  Musculoskeletal: positive for myalgias and arthralgias    Physical Exam:  Vital Signs: Last Filed In 24 Hours Vital Signs: 24 Hour Range   BP: 146/94 (01/05 1530)  Temp: 36.6 ?C (97.9 ?F) (01/05 0865)  Pulse: 83 (01/05 0908)  Respirations: 20 PER MINUTE (01/05 1119)  SpO2: 99 % (01/05 1530)  O2 Device: None (Room air) (01/05 0908)  SpO2 Pulse: 72 (01/05 1530)  Height: 172.7 cm (5' 8) (01/05 0755) BP: (123-160)/(83-100)   Temp:  [36.6 ?C (97.9 ?F)-36.9 ?C (98.4 ?F)]   Pulse:  [75-83]   Respirations:  [20 PER MINUTE]   SpO2:  [97 %-100 %]   O2 Device: None (Room air)          Physical Exam  Vitals and nursing note reviewed.   Constitutional:       General: She is not in acute distress.     Appearance: She is well-developed. She is not diaphoretic.   HENT:      Head: Normocephalic and atraumatic.      Nose: Nose normal.      Mouth/Throat:      Mouth: Mucous membranes are moist.      Pharynx: No oropharyngeal exudate.   Eyes:      General: No scleral icterus.     Pupils: Pupils are equal, round, and reactive to light.   Neck:  Vascular: No JVD.   Cardiovascular:      Rate and Rhythm: Normal rate and regular rhythm.      Pulses: Normal pulses.   Pulmonary:      Effort: Pulmonary effort is normal.      Breath sounds: No wheezing or rales.   Abdominal:      General: Bowel sounds are normal.      Palpations: Abdomen is soft.      Tenderness: There is abdominal tenderness (Supra pubic TTP ).   Musculoskeletal:         General: Swelling (Trace non-pitting edema) present. No tenderness. Normal range of motion.      Cervical back: Normal range of motion and neck supple.   Skin:     General: Skin is warm and dry.      Capillary Refill: Capillary refill takes less than 2 seconds.      Findings: No rash.   Neurological:      General: No focal deficit present.      Mental Status: She is alert and oriented to person, place, and time. Mental status is at baseline.      Motor: No abnormal muscle tone.      Deep Tendon Reflexes: Reflexes normal.   Psychiatric:         Behavior: Behavior normal.           Lab/Radiology/Other Diagnostic Tests:  Pertinent labs reviewed  Glucose: 86 (05/12/21 1148)  Pertinent radiology reviewed.    Jeanella Craze, MD  Pager [phone]

## 2021-05-12 NOTE — Progress Notes
Date of Service: 05/12/2021    Subjective:             Janice Gregory is a 61 y.o. 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  She presents today for ongoing UTI management.         History of Present Illness  Patient was last seen via telemedicine 05/12/2021 to discuss results of urine culture performed through Dr. Lorrin Mais office (see scanned report).  Results demonstrated Pseudomonas aeruginosa 50-100 K with multiple drug resistances.  It was determined that she would be referred to infectious disease for IV antibiotics.    She is being seen today to assess urine postvoid residual volume as well as perform urine cultures both at [facility] and [facility].  Yesterday she reported that she had very little output during the night prior, voiding only in small amounts when she got up 3 times to urinate.  Patient describes moderate low back pain and bloated feeling as well as generalized lower abdominal discomfort.  She has not experienced fever, chills or flank pain.   Patient has been drinking fluids as normal, greater than 65 ounces daily.  She adds that her stool output via colostomy has remained constant, changing her bag on a daily basis.  Patient reported to oncology providers that she has noticed small amounts of mucus, occasionally blood-tinged, rectally.    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  Denies double vision,  Denies glaucoma,  Denies dry eyes  - Cardiovascular ROS:  Denies chest pain.  Denies palpitations currently.  Denies orthopnea  - Respiratory ROS:  Denies shortness of breath,  Denies cough,  Denies wheezing currently  - Gastrointestinal ROS:  Denies constipation,  Denies gastric reflux,  Denies blood in stool,  Denies diarrhea,  Denies Irritable bowel syndrome,  Denies nausea,  Denies vomiting, POS abdominal pain  - Endocrine ROS: POS polydipsia,  Denies excessive sweating.  Denies hot flashes.  Denies Thyroid disease  - Hematological and Lymphatic ROS:  Denies easy bruisability,  Denies current anemia,  Denies adenopathy in the inguinal area  - Neurological ROS:  Denies syncope,  Denies numbness in lower extremities,  Denies neuropathy.  Denies shooting pain down the legs,  Denies shooting pain in the lower back,  Denies low back pain  - Musculoskeletal ROS:  Denies Joint pain.  Denies Edema in lower extremities.  Denies fibromyalgia  - Psychological ROS:  Denies depression,  Denies anxiety,  Denies thoughts of suicide,  Denies thoughts of homicide,  Denies recent seizure,  Denies syncope   - Dermatological ROS:  Denies eczema.  Denies skin rashes in the groin area and other sites  ?  Medical History:   Diagnosis Date   ? Arthritis    ? Complication of anesthesia    ? Connective tissue disease (  HCC)    ? Infection      Surgical History:   Procedure Laterality Date   ? ANORECTAL MANOMETRY N/A 11/11/2019    Performed by Eliott Nine, MD at [facility]   ? LAPAROSCOPIC COLOSTOMY N/A 12/15/2019    Performed by Benetta Spar, MD at [facility] OR   ? ARTHROPLASTY     ? FOOT SURGERY     ? HERNIA REPAIR     ? HX APPENDECTOMY     ? HX CHOLECYSTECTOMY     ? HX HEMORRHOIDECTOMY     ? HX HYSTERECTOMY     ? HX JOINT REPLACEMENT     ? HYSTERECTOMY     ? KNEE SURGERY         Objective:         ? acetaminophen (TYLENOL) 325 mg tablet Take three tablets by mouth every 8 hours as needed for Pain.   ? amitriptyline (ELAVIL) 25 mg tablet Take 1 tablet by mouth daily at bedtime with 50 mg tablet for a total of 75 mg daily at bedtime.   ? amitriptyline (ELAVIL) 50 mg tablet Take one tablet by mouth at bedtime daily.   ? ascorbic acid (VITAMIN C PO) Take  by mouth.   ? ascorbic acid 1,000 mg tablet Take 2 tablets by mouth daily.   ? Black Cohosh 540 mg cap Take 2 capsules by mouth at bedtime daily.   ? ciprofloxacin (CIPRO) 500 mg tablet Please take 500mg  at 11PM the night before surgery.  Indications: ERAS pre-surgical preparation   ? COLLAGEN MISC Take 2 capsules by mouth daily.   ? Cranberry Extract 500 mg cap Take 1 capsule by mouth daily.   ? d-mannose 500 mg cap Take 1,000 mg by mouth twice daily.   ? docosahexaenoic acid/epa (FISH OIL PO) Take  by mouth.   ? docusate sodium (COLACE PO) Take  by mouth.   ? estradioL (ESTRACE) 0.01 % (0.1 mg/g) vaginal cream Insert or Apply one g to vaginal area three times weekly.   ? hydrocortisone (HYTONE) 2.5 % topical cream Apply  topically to affected area twice daily.   ? ibuprofen (ADVIL) 200 mg tablet Take 200 mg by mouth every 6 hours. Take with food.   ? Lactobac no.41/Bifidobact no.7 (PROBIOTIC-10 PO) Take 1 capsule by mouth daily. 20 Billion    ? lifitegrast (XIIDRA) 5 % ophthalmic drop Apply 1 drop to both eyes every 12 hours.   ? methen-m.blue-s.phos-phsal-hyo (URIBEL) 118-10-40.8-36 mg capsule Take one capsule by mouth four times daily. as needed for bladder pain   ? metroNIDAZOLE (FLAGYL) 500 mg tablet Please take at the following times:   1pm: Take 2 tablets of Flagyl (500mg  tablets)   3pm: Take 2 tablets of Flagyl (500mg  tablets)   11pm: Take 2 tablets of Flagyl (500mg  tablets)  *Please try to take all the antibiotics.  Indications: ERAS pre-surgical preparation   ? mv-mn-folic ac-vit K-herb 289 800-100 mcg tab Take  by mouth.   ? nystatin (MYCOSTATIN) 100,000 unit/g topical cream Apply  topically to affected area twice daily.   ? phenazopyridine (PYRIDIUM) 200 mg tablet Take one tablet by mouth three times daily as needed for Pain. Take after meals for up to 2 days.   ? pilocarpine HCL (SALAGEN) 5 mg tablet Take 5 mg by mouth four times daily.   ? polyethylene glycol 3350 (MIRALAX) 17 g packet Take 17 g by mouth daily.   ? safflower oil/linoleic acid,co (CLA PO) Take  1,300 mg by mouth twice daily.   ? simethicone (GAS-X PO) Take  by mouth.   ? sodium chloride (MURO-5) 5 % ophthalmic solution Place 1 drop into or around eye(s) every 3 hours.   ? valACYclovir (VALTREX) 1 gram tablet      Vitals:    05/12/21 0755 05/12/21 0804   BP: (!) 146/100 (!) 150/83   BP Source: Arm, Right Upper Arm, Right Upper   Pulse: 82 75   Temp: 36.9 ?C (98.4 ?F)    Resp: 16    SpO2: 99%    PainSc: Seven    Weight: 76.2 kg (168 lb)    Height: 172.7 cm (5' 8)      Body mass index is 25.54 kg/m?Marland Kitchen     Physical Exam  Constitutional:       General: She is not in acute distress.     Comments: appears fatigued   HENT:      Head: Normocephalic and atraumatic.   Eyes:      General: No scleral icterus.  Cardiovascular:      Rate and Rhythm: Normal rate.   Pulmonary:      Effort: Pulmonary effort is normal. No respiratory distress.   Abdominal:      General: There is no distension.      Palpations: Abdomen is soft.      Tenderness: Tenderness: mild generalized. There is no right CVA tenderness, left CVA tenderness, guarding or rebound.      Hernia: Hernia: parastomal.   Musculoskeletal:         General: No swelling.      Cervical back: Normal range of motion.      Right lower leg: No edema.      Left lower leg: No edema.   Skin:     General: Skin is warm.      Comments: extremely dry   Neurological:      General: No focal deficit present.      Mental Status: She is alert and oriented to person, place, and time. Psychiatric:         Mood and Affect: Mood normal.         Behavior: Behavior normal.         Thought Content: Thought content normal.         Judgment: Judgment normal.       Procedure:  Post void residual urine measurement (as noted in procedure note)  Pre  Void scan- 186 mL  Void in hat- 200 mL  Post void scan- 39 mL     Assessment and Plan:  1. Suspected UTI  URINARY PROCEDURES    EXTRA URINE GRAY TOP    CANCELED: CULTURE-URINE W/SENSITIVITY      2. 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  -- Urine culture and sensitivity canceled, given that patient did not perform midstream specimen.  -- Explained to patient that infectious disease consult cannot be obtained until 05/19/2021 for management of current UTI.  Given the need for more immediate treatment of UTI, explained to patient and her spouse the need to go to the ED to facilitate IV antibiotic therapy for UTI with multiple drug resistances.  General questions answered.  Patient amenable to plan.  -- Patient will keep appointment with infectious disease on 05/19/2021 for consultation.  -- We will plan brief follow-up via  telemedicine over the next several days regarding continued care plan  -- Given her recurrent UTI and symptoms of  rectal mucus/discharge and abdominal bloating we will forward this message to her oncology/colorectal providers.  Parastomal hernia repair is planned for 04/19/2022.  -- Dr. Eulah Pont and Deberah Pelton, NP, patient's colorectal providers notified of his decision.        Roland Rack, PA-C  [facility] APP      Total Time Today was 24 minutes in the following activities: Preparing to see the patient, Obtaining and/or reviewing separately obtained history, Performing a medically appropriate examination and/or evaluation, Counseling and educating the patient/family/caregiver, Ordering medications, tests, or procedures and Documenting clinical information in the electronic or other health record

## 2021-05-12 NOTE — Procedures
Pre  Void scan- 186 mL  Void in hat- 200 mL  Post void scan- 39 mL    Sent voided specimen [facility] for urine culture.     Did not collect straight cath as pt was instructed to go to the ED to avoid duplication of any testing they may order.     Abd exam - general achiness.

## 2021-05-13 ENCOUNTER — Encounter: Admit: 2021-05-13 | Discharge: 2021-05-13 | Payer: BC Managed Care – PPO

## 2021-05-13 MED ADMIN — POLYETHYLENE GLYCOL 3350 17 GRAM PO PWPK [25424]: 17 g | ORAL | @ 17:00:00 | Stop: 2021-05-14 | NDC 00904693186

## 2021-05-13 MED ADMIN — IBUPROFEN 600 MG PO TAB [3844]: 600 mg | ORAL | @ 17:00:00 | Stop: 2021-05-13 | NDC 67877032001

## 2021-05-13 NOTE — Progress Notes
RT Adult Assessment Note    NAME:Janice Gregory             MRN: [id number]             DOB:1961-02-25          AGE: 61 y.o.  ADMISSION DATE: 05/12/2021             DAYS ADMITTED: LOS: 0 days    RT Treatment Plan:            Additional Comments:  Impressions of the patient: alert and oriented   Intervention(s)/outcome(s): none at this time  Patient education that was completed: yes  Recommendations to the care team: none at this time    Vital Signs:  Pulse: 80  RR: 18 PER MINUTE  SpO2: 99 %  O2 Device: None (Room air)  Liter Flow:    O2%:    Breath Sounds: Clear (Implies normal)  Respiratory Effort:  non-labroed

## 2021-05-13 NOTE — ED Notes
Report to [id number] RN

## 2021-05-16 ENCOUNTER — Encounter: Admit: 2021-05-16 | Discharge: 2021-05-16 | Payer: BC Managed Care – PPO

## 2021-05-18 ENCOUNTER — Encounter: Admit: 2021-05-18 | Discharge: 2021-05-18 | Payer: BC Managed Care – PPO

## 2021-05-18 DIAGNOSIS — R3989 Other symptoms and signs involving the genitourinary system: Secondary | ICD-10-CM

## 2021-05-18 LAB — EXTRA URINE GRAY TOP

## 2021-05-19 ENCOUNTER — Ambulatory Visit: Admit: 2021-05-19 | Discharge: 2021-05-19 | Payer: BC Managed Care – PPO

## 2021-05-19 ENCOUNTER — Encounter: Admit: 2021-05-19 | Discharge: 2021-05-19 | Payer: BC Managed Care – PPO

## 2021-05-19 DIAGNOSIS — N3020 Other chronic cystitis without hematuria: Secondary | ICD-10-CM

## 2021-05-19 DIAGNOSIS — K121 Other forms of stomatitis: Secondary | ICD-10-CM

## 2021-05-19 DIAGNOSIS — T8859XA Other complications of anesthesia, initial encounter: Secondary | ICD-10-CM

## 2021-05-19 DIAGNOSIS — M359 Systemic involvement of connective tissue, unspecified: Secondary | ICD-10-CM

## 2021-05-19 DIAGNOSIS — M1990 Unspecified osteoarthritis, unspecified site: Secondary | ICD-10-CM

## 2021-05-19 DIAGNOSIS — B999 Unspecified infectious disease: Secondary | ICD-10-CM

## 2021-05-19 DIAGNOSIS — N390 Urinary tract infection, site not specified: Secondary | ICD-10-CM

## 2021-05-19 DIAGNOSIS — N3080 Other cystitis without hematuria: Secondary | ICD-10-CM

## 2021-05-19 DIAGNOSIS — R3989 Other symptoms and signs involving the genitourinary system: Secondary | ICD-10-CM

## 2021-05-19 LAB — URINALYSIS DIPSTICK REFLEX TO CULTURE
LEUKOCYTES: NEGATIVE
NITRITE: NEGATIVE
URINE ASCORBIC ACID, UA: NEGATIVE
URINE BILE: NEGATIVE
URINE BLOOD: NEGATIVE
URINE KETONE: NEGATIVE

## 2021-05-19 LAB — URINALYSIS MICROSCOPIC REFLEX TO CULTURE

## 2021-05-19 LAB — HERPES SIMPLEX PCR - NON-BLOOD

## 2021-05-19 NOTE — Progress Notes
Date of Service: 05/19/2021    Subjective:             Janice Gregory is a 61 y.o. female.    History of Present Illness  Patient was last seen on 05/12/2021 here for management of UTI.  On 04/3021, patient had urine culture and sensitivity performed through her PCP office.  This was ordered by [place].  Urine culture demonstrated Pseudomonas aeruginosa 50-100 K colony count with limited drug sensitivities.  Given this information and multiple drug resistances, patient was referred to [facility] ED for IV antibiotic management of UTI.    There, patient was treated with tobramycin on 05/12/2021, 320 mg IVPB x1 dose.  Importantly urine Pap not 6 testing performed on 05/12/2021 here in the office before patient went to the ED demonstrated Pseudomonas was aeruginosa greater than 100 K.  It also showed Enterococcus faecium 10-49 K.  (see scanned document)    Patient states that for approximately 36 to 48 hours after receiving the IV piggyback tobramycin, she had no incidence of urethral or bladder spasms or discomfort.  She did however have significant, explosive diarrhea from her colostomy bag.  She also adds that she was told that her urine collection that day at ED  via catheterized specimen was .    Patient has appointment with ID provider, Dr Loni Beckwith today.        Pertinent Review of Systems:  - General ROS:  Denies recent weight change above 10 pounds,  Denies malaise  - HEENT ROS:  Denies blurring of vision.  Denies double vision,  Denies glaucoma,  Denies dry eyes  - Cardiovascular ROS:  Denies chest pain.  Denies palpitations currently.  Denies orthopnea  - Respiratory ROS:  Denies shortness of breath,  Denies cough,  Denies wheezing currently  - Gastrointestinal ROS:  Denies constipation,  Denies gastric reflux,  Denies blood in stool,  Denies diarrhea,  Denies Irritable bowel syndrome,  Denies nausea,  Denies vomiting,  Denies abdominal pain  - Endocrine ROS:  Denies polydipsia,  Denies excessive sweating.  Denies hot flashes.  Denies Thyroid disease  - Hematological and Lymphatic ROS:  Denies easy bruisability,  Denies current anemia,  Denies adenopathy in the inguinal area  - Neurological ROS:  Denies syncope,  Denies numbness in lower extremities,  Denies neuropathy.  Denies shooting pain down the legs,  Denies shooting pain in the lower back,  Denies low back pain  - Musculoskeletal ROS:  Denies Joint pain.  Denies Edema in lower extremities.  Denies fibromyalgia  - Psychological ROS:  Denies depression,  Denies anxiety,  Denies thoughts of suicide,  Denies thoughts of homicide,  Denies recent seizure,  Denies syncope   - Dermatological ROS:  Denies eczema.  Denies skin rashes in the groin area and other sites  ?  Medical History:   Diagnosis Date   ? Arthritis    ? Complication of anesthesia    ? Connective tissue disease (HCC)    ? Infection      Surgical History:   Procedure Laterality Date   ? ANORECTAL MANOMETRY N/A 11/11/2019    Performed by Eliott Nine[facility] MD at [facility]   ? LAPAROSCOPIC COLOSTOMY N/A 12/15/2019    Performed by Benetta Spar, MD at [facility] OR   ? ARTHROPLASTY     ? FOOT SURGERY     ? HERNIA REPAIR     ? HX APPENDECTOMY     ? HX CHOLECYSTECTOMY     ?  HX HEMORRHOIDECTOMY     ? HX HYSTERECTOMY     ? HX JOINT REPLACEMENT     ? HYSTERECTOMY     ? KNEE SURGERY       Objective:         ? acetaminophen (TYLENOL) 325 mg tablet Take three tablets by mouth every 8 hours as needed for Pain. (Patient taking differently: Take 650 mg by mouth every 8 hours as needed for Pain.)   ? amitriptyline (ELAVIL) 25 mg tablet Take 1 tablet by mouth daily at bedtime with 50 mg tablet for a total of 75 mg daily at bedtime. (Patient taking differently: Take 25 mg by mouth at bedtime as needed for Pain.)   ? amitriptyline (ELAVIL) 50 mg tablet Take one tablet by mouth at bedtime daily.   ? ascorbic acid (VITAMIN C PO) Take 1,200 mg by mouth daily. Liposomal Vit C   ? Black Cohosh 540 mg cap Take 2 capsules by mouth at bedtime daily. ? COLLAGEN MISC Take 1 capsule by mouth daily.   ? Cranberry Extract 500 mg cap Take 1 capsule by mouth daily.   ? docosahexaenoic acid/epa (FISH OIL PO) Take 1,000 mg by mouth daily.   ? docusate sodium (COLACE PO) Take 200 mg by mouth twice daily.   ? estradioL (ESTRACE) 0.01 % (0.1 mg/g) vaginal cream Insert or Apply one g to vaginal area three times weekly.   ? hydrocortisone (HYTONE) 2.5 % topical cream Apply  topically to affected area twice daily. (Patient taking differently: Apply  topically to affected area twice daily as needed.)   ? ibuprofen (ADVIL) 200 mg tablet Take 200-400 mg by mouth every 6 hours as needed. Take with food.   ? Lactobac no.41/Bifidobact no.7 (PROBIOTIC-10 PO) Take 1 capsule by mouth daily. 20 Billion    ? mv-mn-folic ac-vit K-herb 289 800-100 mcg tab Take  by mouth daily.   ? nystatin (MYCOSTATIN) 100,000 unit/g topical cream Apply  topically to affected area twice daily as needed (Rash).   ? nystatin-triamcinolone 100,000 unit/g / 0.1 % topical cream Apply  topically to affected area four times daily.   ? phenazopyridine (PYRIDIUM) 200 mg tablet Take one tablet by mouth three times daily as needed for Pain. Take after meals for up to 2 days.   ? pilocarpine HCL (SALAGEN) 5 mg tablet Take 5 mg by mouth four times daily.   ? polyethylene glycol 3350 (MIRALAX) 17 g packet Take 17 g by mouth twice daily.   ? safflower oil/linoleic acid,co (CLA PO) Take 1,300 mg by mouth twice daily.   ? simethicone (GAS-X PO) Take 2 tablets by mouth at bedtime daily.   ? sodium chloride (MURO-5) 5 % ophthalmic solution Apply 1 drop to both eyes as Needed.   ? valACYclovir (VALTREX) 1 gram tablet Take 1,000 mg by mouth once. PRN Shingles     Vitals:    05/19/21 0818   BP: (!) 132/90   Pulse: 97   Temp: 36.3 ?C (97.3 ?F)   Resp: 16   SpO2: 97%   TempSrc: Temporal   PainSc: Four   Weight: 77.1 kg (170 lb)   Height: 175.3 cm (5' 9)     Body mass index is 25.1 kg/m?Marland Kitchen     Physical Exam  Vitals reviewed. Constitutional:       General: She is not in acute distress.     Appearance: Normal appearance. She is not ill-appearing.   HENT:      Head:  Normocephalic.   Eyes:      General: No scleral icterus.  Pulmonary:      Effort: Pulmonary effort is normal. No respiratory distress.   Musculoskeletal:         General: Normal range of motion.      Cervical back: Normal range of motion.   Skin:     General: Skin is warm and dry.   Neurological:      General: No focal deficit present.      Mental Status: She is alert and oriented to person, place, and time.   Psychiatric:         Mood and Affect: Mood normal.         Behavior: Behavior normal.         Thought Content: Thought content normal.         Judgment: Judgment normal.       1. Recurrent UTI        2. 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  --Given patient history of uti x 2 in past six months, she meets management criteria for recurrent UTI, however ID provider, Dr Drue Dun, consulted with patient in the ED on 05/12/21 and recommended that patient stop all supplements given her bladder symptoms.  --We will await the results of ID consult today for further management recommendations.  --Encouraged patient to contact Dr Daphine Deutscher to discuss UTI preventive strategies/management plan prior to 06/20/21 parastomal hernia repair surgery.  --We will follow up with patient regarding whether to discontinue BI's for now.    Roland Rack, PA-C  [facility] APP    Total Time Today was 22 minutes in the following activities: Preparing to see the patient, Obtaining and/or reviewing separately obtained history, Performing a medically appropriate examination and/or evaluation, Counseling and educating the patient/family/caregiver, Ordering medications, tests, or procedures and Documenting clinical information in the electronic or other health record

## 2021-05-19 NOTE — Patient Instructions
Your bladder symptoms may improve with changes in your diet.    Please read this document for suggestions on dietary changes:    [email or web address]

## 2021-05-19 NOTE — Progress Notes
Pertinent Review of Systems:  - General ROS:  Denies recent weight change above 10 pounds,  Denies malaise  - HEENT ROS:  Denies blurring of vision.  Denies double vision,  Denies glaucoma,  Denies dry eyes  - Cardiovascular ROS:  Denies chest pain.  Denies palpitations currently.  Denies orthopnea  - Respiratory ROS:  Denies shortness of breath,  Denies cough,  Denies wheezing currently  - Gastrointestinal ROS:  Denies constipation,  Denies gastric reflux,  Denies blood in stool,  Denies diarrhea,  Denies Irritable bowel syndrome,  Denies nausea,  Denies vomiting,  Denies abdominal pain  - Endocrine ROS:  Denies polydipsia,  Denies excessive sweating.  Denies hot flashes.  Denies Thyroid disease  - Hematological and Lymphatic ROS:  Denies easy bruisability,  Denies current anemia,  Denies adenopathy in the inguinal area  - Neurological ROS:  Denies syncope,  Denies numbness in lower extremities,  Denies neuropathy.  Denies shooting pain down the legs,  Denies shooting pain in the lower back,  Denies low back pain  - Musculoskeletal ROS:  Denies Joint pain.  Denies Edema in lower extremities.  Denies fibromyalgia  - Psychological ROS:  Denies depression,  Denies anxiety,  Denies thoughts of suicide,  Denies thoughts of homicide,  Denies recent seizure,  Denies syncope   - Dermatological ROS:  Denies eczema.  Denies skin rashes in the groin area and other sites

## 2021-05-19 NOTE — Progress Notes
Infectious Diseases Progress Note    Today's Date:  05/19/2021    Assessment:     Chronic cystitis   Recurrent UTI   - 03/15/2021 cystoscopy showed bladder inflammation with petechiae.  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    - 03/15/2021 urodynamic studies showed evidence of retention  - starting in November treated with Ceftin and Keflex chronically - pt didn't think it was helping   - no difference with weekly bladder instillations comprised of bicarbonate, heparin, lidocaine, Solu-Cortef, and normal saline, most recently 05/05/2021  - not taking D-mannose  - using topical estrogen triweekly   - 05/06/2021 UA showed 6-10 wbc's with 2+ bacteria, 1+ leukocyte esterase, negative nitrite, culture grew 50 to 100,000 CFUs of Pseudomonas aeruginosa with resistance to Levaquin, intermediate to ciprofloxacin, ceftazidime, and cefepime and sensitive to gentamicin, imipenem, and tobramycin.    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   Discussed dietary changes to help manage interstitial cystitis type symptoms, provided link to resource in visit instructions.   Discussed trying to keep the perineal area cleaner when she is emptying her ostomy bag as the bag rests above her vulvar area.  Swabbed oral lesions for HSV PCR, will treat with Valtrex if positive.   Follow up with UroGynecology.     RTC in 4 weeks if requires treatment for UTI.     Thank you for the consult.    Mora Bellman, MD  Division of Infectious Diseases  [facility]        History of Present Illness    Janice Gregory is a 61 y.o. patient who I am seeing today in follow up from recent admission for recurrent UTI.    She reports a 2 year history of the following symptoms:    Has urinary hesitancy, then she voids after she has an episode of chills.  She has burning just at the beginning of the stream.  She sometimes has urgency with position changes from sitting to standing.  No urinary frequency.   She has to push to urinate.     She stopped taking keflex when she was in the hospital.     Her urinary symptoms recurred just a few days after hospital discharge.     She has chronic mucus rectal drainage.   Has colostomy.    Ostomy bag rests above her vulvar area.  Sometimes the bag seal breaks and she has had explosive  diarrhea.     Has hot flashes in the evenings for some time now.     Allergies   No Known Allergies    Review of Systems   A comprehensive 14-point review of systems was negative with exception of the review of systems as noted in the history of present illness.    Medications       Current Outpatient Medications:   ?  acetaminophen (TYLENOL) 325 mg tablet, Take three tablets by mouth every 8 hours as needed for Pain. (Patient taking differently: Take 650 mg by mouth every 8 hours as needed for Pain.), Disp: 40 tablet, Rfl: 0  ?  amitriptyline (ELAVIL) 25 mg tablet, Take 1 tablet by mouth daily at bedtime with 50 mg tablet for a total of 75 mg daily at bedtime. (Patient taking differently: Take 25 mg by mouth at bedtime as needed for Pain.), Disp: 90 tablet, Rfl: 3  ?  amitriptyline (ELAVIL) 50 mg tablet, Take one tablet by mouth at bedtime daily., Disp: 90 tablet, Rfl: 0  ?  ascorbic acid (VITAMIN C PO), Take 1,200 mg by mouth daily. Liposomal Vit C, Disp: , Rfl:   ?  Black Cohosh 540 mg cap, Take 2 capsules by mouth at bedtime daily., Disp: , Rfl:   ?  COLLAGEN MISC, Take 1 capsule by mouth daily., Disp: , Rfl:   ?  Cranberry Extract 500 mg cap, Take 1 capsule by mouth daily., Disp: , Rfl:   ?  docosahexaenoic acid/epa (FISH OIL PO), Take 1,000 mg by mouth daily., Disp: , Rfl:   ?  docusate sodium (COLACE PO), Take 200 mg by mouth twice daily., Disp: , Rfl:   ?  estradioL (ESTRACE) 0.01 % (0.1 mg/g) vaginal cream, Insert or Apply one g to vaginal area three times weekly., Disp: 42.5 g, Rfl: 3  ?  hydrocortisone (HYTONE) 2.5 % topical cream, Apply  topically to affected area twice daily. (Patient taking differently: Apply  topically to affected area twice daily as needed.), Disp: 30 g, Rfl: 2  ?  ibuprofen (ADVIL) 200 mg tablet, Take 200-400 mg by mouth every 6 hours as needed. Take with food., Disp: , Rfl:   ?  Lactobac no.41/Bifidobact no.7 (PROBIOTIC-10 PO), Take 1 capsule by mouth daily. 20 Billion , Disp: , Rfl:   ?  mv-mn-folic ac-vit K-herb 289 800-100 mcg tab, Take  by mouth daily., Disp: , Rfl:   ?  nystatin (MYCOSTATIN) 100,000 unit/g topical cream, Apply  topically to affected area twice daily as needed (Rash)., Disp: , Rfl:   ?  nystatin-triamcinolone 100,000 unit/g / 0.1 % topical cream, Apply  topically to affected area four times daily., Disp: , Rfl:   ?  phenazopyridine (PYRIDIUM) 200 mg tablet, Take one tablet by mouth three times daily as needed for Pain. Take after meals for up to 2 days., Disp: 20 tablet, Rfl: 0  ?  pilocarpine HCL (SALAGEN) 5 mg tablet, Take 5 mg by mouth four times daily., Disp: , Rfl:   ?  polyethylene glycol 3350 (MIRALAX) 17 g packet, Take 17 g by mouth twice daily., Disp: , Rfl:   ?  safflower oil/linoleic acid,co (CLA PO), Take 1,300 mg by mouth twice daily., Disp: , Rfl:   ?  simethicone (GAS-X PO), Take 2 tablets by mouth at bedtime daily., Disp: , Rfl:   ?  sodium chloride (MURO-5) 5 % ophthalmic solution, Apply 1 drop to both eyes as Needed., Disp: , Rfl:   ?  valACYclovir (VALTREX) 1 gram tablet, Take 1,000 mg by mouth once. PRN Shingles, Disp: , Rfl:     Current Facility-Administered Medications:   ?  lidocaine hcl (XYLOCAINE) 2 % jelly (URO-JET), , Topical, PRN, Oletta Lamas, MD, Given at 04/21/21 0830      Physical Examination       132/90   Pulse -- 97   Resp -- 16   Temp -- 36.3 ?C (97.3 ?F)   Temp src -- Temporal   SpO2 -- 97 %   Weight -- 77.1 kg (170 lb)   Height -- 175.3 cm (5' 9)   Pain Score Four Four   Pain Loc RECTUM --     General appearance: alert, in no acute distress  HENT: mucus membranes moist, no thrush, bilateral posterior buccal mucosa below the maxillary molars with small ulcerations and one the right a few scattered vesicles   Eyes: PERRL, EOM grossly intact, Conj nl  Neck: supple, no lymphadenopathy  Lungs: no wheezing, rhonchi, rales appreciated, moving air throughout  Heart: Regular rhythm, normal rate, with no murmur, rub, gallop  Abdomen: soft, non-tender, non-distended, normoactive bowel sounds, LLQ colostomy with bag in place  No CVA tenderness bilaterally  Ext:  No clubbing, cyanosis or edema  Musculo: no joint swelling or effusions  Skin: no rashes/lesions  Neuro: oriented x 3, moves all extremities    Laboratory - reviewed results of below individual lab tests      Latest Reference Range & Units 05/13/21 03:49   Hemoglobin 12.0 - 15.0 GM/DL 16.1   Hematocrit 36 - 45 % 37.6   Platelet Count 150 - 400 K/UL 215   White Blood Cells 4.5 - 11.0 K/UL 4.4 (L)   Neutrophils 41 - 77 % 56   Absolute Neutrophil Count 1.8 - 7.0 K/UL 2.41   Lymphocytes 24 - 44 % 30   Absolute Lymph Count 1.0 - 4.8 K/UL 1.33   Monocytes 4 - 12 % 7   Absolute Monocyte Count 0 - 0.80 K/UL 0.32   Eosinophils 0 - 5 % 6 (H)   Absolute Eosinophil Count 0 - 0.45 K/UL 0.28   Absolute Basophil Count 0 - 0.20 K/UL 0.05   Basophils 0 - 2 % 1   RBC 4.0 - 5.0 M/UL 4.04   MCV 80 - 100 FL 93.0   MCH 26 - 34 PG 31.8   MCHC 32.0 - 36.0 G/DL 09.6   MPV 7 - 11 FL 7.9   RDW 11 - 15 % 12.6   Sodium 137 - 147 MMOL/L 140   Potassium 3.5 - 5.1 MMOL/L 4.0   Chloride 98 - 110 MMOL/L 104   CO2 21 - 30 MMOL/L 28   Anion Gap 3 - 12  8   Blood Urea Nitrogen 7 - 25 MG/DL 15   Creatinine 0.4 - 0.45 MG/DL 4.09   eGFR >81 mL/min >60   Glucose 70 - 100 MG/DL 72   Albumin 3.5 - 5.0 G/DL 3.7   Calcium 8.5 - 19.1 MG/DL 9.1   Total Bilirubin 0.3 - 1.2 MG/DL 1.0   Total Protein 6.0 - 8.0 G/DL 6.0   AST (SGOT) 7 - 40 U/L 23   ALT (SGPT) 7 - 56 U/L 19   Alk Phosphatase 25 - 110 U/L 48   (L): Data is abnormally low  (H): Data is abnormally high    Microbiology, Radiology and other Diagnostics Review   Microbiology data reviewed.

## 2021-05-20 ENCOUNTER — Encounter: Admit: 2021-05-20 | Discharge: 2021-05-20 | Payer: BC Managed Care – PPO

## 2021-05-20 DIAGNOSIS — N3010 Interstitial cystitis (chronic) without hematuria: Secondary | ICD-10-CM

## 2021-05-20 MED ORDER — METRONIDAZOLE 500 MG PO TAB
ORAL_TABLET | 0 refills
Start: 2021-05-20 — End: ?

## 2021-05-20 MED ORDER — AMITRIPTYLINE 50 MG PO TAB
50 mg | ORAL_TABLET | Freq: Every evening | ORAL | 0 refills | Status: AC
Start: 2021-05-20 — End: ?

## 2021-05-20 MED ORDER — NEOMYCIN 500 MG PO TAB
ORAL_TABLET | 0 refills
Start: 2021-05-20 — End: ?

## 2021-05-20 NOTE — Telephone Encounter
Pt requested a refill on Amitryptline to [facility]. E-scripted per Roland RackEllie Bulmash PA-C. Pt will fax records from prior pelvic abscess infection to our office. Then have tele visit to review.

## 2021-05-24 ENCOUNTER — Encounter: Admit: 2021-05-24 | Discharge: 2021-05-24 | Payer: BC Managed Care – PPO

## 2021-05-24 MED ORDER — CIPROFLOXACIN HCL 500 MG PO TAB
ORAL_TABLET | ORAL | 0 refills | 10.00000 days | Status: AC
Start: 2021-05-24 — End: ?
  Filled 2021-05-25: qty 1, 1d supply, fill #1

## 2021-05-25 ENCOUNTER — Encounter: Admit: 2021-05-25 | Discharge: 2021-05-25 | Payer: BC Managed Care – PPO

## 2021-05-25 DIAGNOSIS — B999 Unspecified infectious disease: Secondary | ICD-10-CM

## 2021-05-25 DIAGNOSIS — T8859XA Other complications of anesthesia, initial encounter: Secondary | ICD-10-CM

## 2021-05-25 DIAGNOSIS — M1990 Unspecified osteoarthritis, unspecified site: Secondary | ICD-10-CM

## 2021-05-25 DIAGNOSIS — M359 Systemic involvement of connective tissue, unspecified: Secondary | ICD-10-CM

## 2021-05-25 MED FILL — METRONIDAZOLE 500 MG PO TAB: 500 mg | 1 days supply | Qty: 6 | Fill #1 | Status: AC

## 2021-05-25 MED FILL — PRESURGERY KIT A: 1 days supply | Qty: 1 | Fill #1 | Status: AC

## 2021-05-26 ENCOUNTER — Encounter: Admit: 2021-05-26 | Discharge: 2021-05-26 | Payer: BC Managed Care – PPO

## 2021-05-28 ENCOUNTER — Encounter: Admit: 2021-05-28 | Discharge: 2021-05-28 | Payer: BC Managed Care – PPO

## 2021-05-28 DIAGNOSIS — B999 Unspecified infectious disease: Secondary | ICD-10-CM

## 2021-05-28 DIAGNOSIS — M1990 Unspecified osteoarthritis, unspecified site: Secondary | ICD-10-CM

## 2021-05-28 DIAGNOSIS — T8859XA Other complications of anesthesia, initial encounter: Secondary | ICD-10-CM

## 2021-05-28 DIAGNOSIS — M359 Systemic involvement of connective tissue, unspecified: Secondary | ICD-10-CM

## 2021-06-01 ENCOUNTER — Encounter: Admit: 2021-06-01 | Discharge: 2021-06-01 | Payer: BC Managed Care – PPO

## 2021-06-01 NOTE — Telephone Encounter
I called and LVM with Tomie China offering to move her surgery forward due to a cancellation on Dr. Ermalene Searing schedule.   Pt asked to call back to confirm new DOS 06/06/21.

## 2021-06-02 ENCOUNTER — Encounter: Admit: 2021-06-02 | Discharge: 2021-06-02 | Payer: BC Managed Care – PPO

## 2021-06-09 ENCOUNTER — Encounter: Admit: 2021-06-09 | Discharge: 2021-06-09 | Payer: BC Managed Care – PPO

## 2021-06-17 ENCOUNTER — Encounter: Admit: 2021-06-17 | Discharge: 2021-06-17 | Payer: BC Managed Care – PPO

## 2021-06-20 ENCOUNTER — Encounter: Admit: 2021-06-20 | Discharge: 2021-06-20 | Payer: BC Managed Care – PPO

## 2021-06-20 ENCOUNTER — Inpatient Hospital Stay: Admit: 2021-06-20 | Discharge: 2021-06-20 | Payer: BC Managed Care – PPO

## 2021-06-20 DIAGNOSIS — T8859XA Other complications of anesthesia, initial encounter: Secondary | ICD-10-CM

## 2021-06-20 DIAGNOSIS — B999 Unspecified infectious disease: Secondary | ICD-10-CM

## 2021-06-20 DIAGNOSIS — M1990 Unspecified osteoarthritis, unspecified site: Secondary | ICD-10-CM

## 2021-06-20 DIAGNOSIS — M359 Systemic involvement of connective tissue, unspecified: Secondary | ICD-10-CM

## 2021-06-20 MED ORDER — ROCURONIUM 10 MG/ML IV SOLN
INTRAVENOUS | 0 refills | Status: DC
Start: 2021-06-20 — End: 2021-06-20
  Administered 2021-06-20 (×3): 20 mg via INTRAVENOUS
  Administered 2021-06-20: 16:00:00 10 mg via INTRAVENOUS
  Administered 2021-06-20: 15:00:00 20 mg via INTRAVENOUS
  Administered 2021-06-20: 14:00:00 40 mg via INTRAVENOUS

## 2021-06-20 MED ORDER — ARTIFICIAL TEARS (PF) SINGLE DOSE DROPS GROUP
OPHTHALMIC | 0 refills | Status: DC
Start: 2021-06-20 — End: 2021-06-20
  Administered 2021-06-20: 14:00:00 2 [drp] via OPHTHALMIC

## 2021-06-20 MED ORDER — DEXAMETHASONE SODIUM PHOSPHATE 4 MG/ML IJ SOLN
INTRAVENOUS | 0 refills | Status: DC
Start: 2021-06-20 — End: 2021-06-20
  Administered 2021-06-20: 14:00:00 4 mg via INTRAVENOUS

## 2021-06-20 MED ORDER — LACTATED RINGERS IV SOLP
INTRAVENOUS | 0 refills | Status: DC
Start: 2021-06-20 — End: 2021-06-20
  Administered 2021-06-20: 15:00:00 via INTRAVENOUS

## 2021-06-20 MED ORDER — ALBUMIN, HUMAN 5 % 250 ML IV SOLP (AN)(OSM)
INTRAVENOUS | 0 refills | Status: DC
Start: 2021-06-20 — End: 2021-06-20
  Administered 2021-06-20: 15:00:00 via INTRAVENOUS

## 2021-06-20 MED ORDER — FENTANYL CITRATE (PF) 50 MCG/ML IJ SOLN
INTRAVENOUS | 0 refills | Status: DC
Start: 2021-06-20 — End: 2021-06-20
  Administered 2021-06-20 (×2): 50 ug via INTRAVENOUS

## 2021-06-20 MED ORDER — LIDOCAINE (PF) 200 MG/10 ML (2 %) IJ SYRG
INTRAVENOUS | 0 refills | Status: DC
Start: 2021-06-20 — End: 2021-06-20
  Administered 2021-06-20: 14:00:00 70 mg via INTRAVENOUS

## 2021-06-20 MED ORDER — PROPOFOL 10 MG/ML IV EMUL 100 ML (INFUSION)(AM)(OR)
INTRAVENOUS | 0 refills | Status: DC
Start: 2021-06-20 — End: 2021-06-20
  Administered 2021-06-20: 15:00:00 175 ug/kg/min via INTRAVENOUS
  Administered 2021-06-20: 14:00:00 150 ug/kg/min via INTRAVENOUS

## 2021-06-20 MED ORDER — MIDAZOLAM 1 MG/ML IJ SOLN
INTRAVENOUS | 0 refills | Status: DC
Start: 2021-06-20 — End: 2021-06-20
  Administered 2021-06-20: 14:00:00 2 mg via INTRAVENOUS

## 2021-06-20 MED ORDER — ELECTROLYTE-A IV SOLP
INTRAVENOUS | 0 refills | Status: DC
Start: 2021-06-20 — End: 2021-06-20
  Administered 2021-06-20: 14:00:00 via INTRAVENOUS

## 2021-06-20 MED ORDER — PROPOFOL INJ 10 MG/ML IV VIAL
INTRAVENOUS | 0 refills | Status: DC
Start: 2021-06-20 — End: 2021-06-20
  Administered 2021-06-20: 17:00:00 30 mg via INTRAVENOUS
  Administered 2021-06-20: 14:00:00 130 mg via INTRAVENOUS

## 2021-06-20 MED ORDER — ALBUTEROL SULFATE 90 MCG/ACTUATION IN HFAA
RESPIRATORY_TRACT | 0 refills | Status: DC
Start: 2021-06-20 — End: 2021-06-20
  Administered 2021-06-20: 17:00:00 6 via RESPIRATORY_TRACT

## 2021-06-20 MED ORDER — PHENYLEPHRINE HCL IN 0.9% NACL 1 MG/10 ML (100 MCG/ML) IV SYRG
INTRAVENOUS | 0 refills | Status: DC
Start: 2021-06-20 — End: 2021-06-20
  Administered 2021-06-20 (×3): 100 ug via INTRAVENOUS

## 2021-06-20 MED ORDER — SUGAMMADEX 100 MG/ML IV SOLN
INTRAVENOUS | 0 refills | Status: DC
Start: 2021-06-20 — End: 2021-06-20
  Administered 2021-06-20: 18:00:00 150 mg via INTRAVENOUS

## 2021-06-20 MED ORDER — HYDROMORPHONE (PF) 2 MG/ML IJ SYRG
INTRAVENOUS | 0 refills | Status: DC
Start: 2021-06-20 — End: 2021-06-20
  Administered 2021-06-20 (×2): .4 mg via INTRAVENOUS

## 2021-06-20 MED ORDER — ONDANSETRON HCL (PF) 4 MG/2 ML IJ SOLN
INTRAVENOUS | 0 refills | Status: DC
Start: 2021-06-20 — End: 2021-06-20
  Administered 2021-06-20: 18:00:00 4 mg via INTRAVENOUS

## 2021-06-20 MED ADMIN — CELECOXIB 200 MG PO CAP [76958]: 200 mg | ORAL | @ 13:00:00 | Stop: 2021-06-20 | NDC 00904650361

## 2021-06-20 MED ADMIN — METRONIDAZOLE IN NACL (ISO-OS) 500 MG/100 ML IV PGBK [5018]: 500 mg | INTRAVENOUS | @ 13:00:00 | Stop: 2021-06-20 | NDC 47335099301

## 2021-06-20 MED ADMIN — BUPIVACAINE HCL 0.25 % (2.5 MG/ML) IJ SOLN [1222]: 5.5 mL | INTRAMUSCULAR | @ 17:00:00 | Stop: 2021-06-20 | NDC 63323046501

## 2021-06-20 MED ADMIN — LIDOCAINE-EPINEPHRINE 2 %-1:100,000 IJ SOLN [15954]: 5.5 mL | INTRAMUSCULAR | @ 17:00:00 | Stop: 2021-06-20 | NDC 00409318221

## 2021-06-20 MED ADMIN — FENTANYL CITRATE (PF) 50 MCG/ML IJ SOLN [3037]: 50 ug | INTRAVENOUS | @ 18:00:00 | Stop: 2021-06-20 | NDC 00409909412

## 2021-06-20 MED ADMIN — HYDROMORPHONE (PF) 2 MG/ML IJ SYRG [163476]: 0.5 mg | INTRAVENOUS | @ 18:00:00 | Stop: 2021-06-20 | NDC 00409131203

## 2021-06-20 MED ADMIN — NALOXEGOL 12.5 MG PO TAB [324272]: 25 mg | ORAL | @ 13:00:00 | Stop: 2021-06-20 | NDC 57841130001

## 2021-06-20 MED ADMIN — HYDROMORPHONE (PF) 2 MG/ML IJ SYRG [163476]: 0.5 mg | INTRAVENOUS | @ 19:00:00 | Stop: 2021-06-20 | NDC 00409131203

## 2021-06-20 MED ADMIN — ACETAMINOPHEN 500 MG PO TAB [102]: 1000 mg | ORAL | @ 13:00:00 | Stop: 2021-06-20 | NDC 00904673061

## 2021-06-20 MED ADMIN — SODIUM CHLORIDE 0.9 % IV SOLP [27838]: 250 mL | INTRAVENOUS | @ 13:00:00 | Stop: 2021-06-22 | NDC 00338004902

## 2021-06-20 MED ADMIN — OXYCODONE 5 MG PO TAB [10814]: 5 mg | ORAL | @ 19:00:00 | Stop: 2021-06-20 | NDC 00904696661

## 2021-06-20 MED ADMIN — SCOPOLAMINE BASE 1 MG OVER 3 DAYS TD PT3D [82141]: 1 | TRANSDERMAL | @ 14:00:00 | NDC 50742050501

## 2021-06-20 MED ADMIN — FENTANYL CITRATE (PF) 50 MCG/ML IJ SOLN [3037]: 25 ug | INTRAVENOUS | @ 19:00:00 | Stop: 2021-06-20 | NDC 00409909412

## 2021-06-20 MED ADMIN — LACTATED RINGERS IV SOLP [4318]: 1000.000 mL | INTRAVENOUS | @ 19:00:00 | Stop: 2021-06-21 | NDC 00338011704

## 2021-06-20 MED ADMIN — ACETAMINOPHEN 500 MG PO TAB [102]: 1000 mg | ORAL | @ 20:00:00 | Stop: 2021-06-23 | NDC 00904673061

## 2021-06-20 MED ADMIN — GABAPENTIN 300 MG PO CAP [18308]: 600 mg | ORAL | @ 13:00:00 | Stop: 2021-06-20 | NDC 00904666661

## 2021-06-20 MED ADMIN — CEFAZOLIN IN 0.9% SOD CHLORIDE 2 GRAM/110 ML IVPB [213015]: 2 g | INTRAVENOUS | @ 14:00:00 | Stop: 2021-06-20 | NDC 54029373309

## 2021-06-20 MED ADMIN — FENTANYL CITRATE (PF) 50 MCG/ML IJ SOLN [3037]: 25 ug | INTRAVENOUS | @ 18:00:00 | Stop: 2021-06-20 | NDC 00409909412

## 2021-06-20 MED ADMIN — TRAMADOL 50 MG PO TAB [14632]: 50 mg | ORAL | @ 19:00:00 | NDC 00904717961

## 2021-06-21 ENCOUNTER — Encounter: Admit: 2021-06-21 | Discharge: 2021-06-21 | Payer: BC Managed Care – PPO

## 2021-06-21 MED ADMIN — DOCUSATE SODIUM 100 MG PO CAP [2566]: 200 mg | ORAL | @ 15:00:00 | NDC 00904718361

## 2021-06-21 MED ADMIN — LACTATED RINGERS IV SOLP [4318]: 1000.000 mL | INTRAVENOUS | Stop: 2021-06-21 | NDC 00338011704

## 2021-06-21 MED ADMIN — CEFAZOLIN INJ 1GM IVP [210319]: 2 g | INTRAVENOUS | @ 15:00:00 | Stop: 2021-06-21 | NDC 60505614200

## 2021-06-21 MED ADMIN — PILOCARPINE HCL 5 MG PO TAB [12803]: 5 mg | ORAL | @ 03:00:00 | NDC 68084092895

## 2021-06-21 MED ADMIN — ARTIFICIAL TEARS (PF) SINGLE DOSE DROPS GROUP [280009]: 1 [drp] | OPHTHALMIC | @ 15:00:00 | NDC 00065806301

## 2021-06-21 MED ADMIN — ACETAMINOPHEN 500 MG PO TAB [102]: 1000 mg | ORAL | @ 21:00:00 | Stop: 2021-06-23 | NDC 00904673061

## 2021-06-21 MED ADMIN — CEFAZOLIN INJ 1GM IVP [210319]: 2 g | INTRAVENOUS | @ 07:00:00 | Stop: 2021-06-21 | NDC 60505614200

## 2021-06-21 MED ADMIN — PILOCARPINE HCL 5 MG PO TAB [12803]: 5 mg | ORAL | @ 21:00:00 | NDC 68084092895

## 2021-06-21 MED ADMIN — GABAPENTIN 300 MG PO CAP [18308]: 300 mg | ORAL | @ 03:00:00 | Stop: 2021-06-23 | NDC 00904666661

## 2021-06-21 MED ADMIN — GABAPENTIN 300 MG PO CAP [18308]: 300 mg | ORAL | @ 15:00:00 | Stop: 2021-06-23 | NDC 00904666661

## 2021-06-21 MED ADMIN — ENOXAPARIN 40 MG/0.4 ML SC SYRG [85052]: 40 mg | SUBCUTANEOUS | @ 07:00:00 | NDC 00781324602

## 2021-06-21 MED ADMIN — DOCUSATE SODIUM 100 MG PO CAP [2566]: 200 mg | ORAL | @ 03:00:00 | NDC 00904718361

## 2021-06-21 MED ADMIN — METRONIDAZOLE IN NACL (ISO-OS) 500 MG/100 ML IV PGBK [5018]: 500 mg | INTRAVENOUS | Stop: 2021-06-21 | NDC 47335099301

## 2021-06-21 MED ADMIN — TRAMADOL 50 MG PO TAB [14632]: 50 mg | ORAL | @ 17:00:00 | NDC 00904717961

## 2021-06-21 MED ADMIN — METRONIDAZOLE IN NACL (ISO-OS) 500 MG/100 ML IV PGBK [5018]: 500 mg | INTRAVENOUS | @ 07:00:00 | Stop: 2021-06-21 | NDC 47335099301

## 2021-06-21 MED ADMIN — POTASSIUM CHLORIDE 20 MEQ PO TBTQ [35943]: 40 meq | ORAL | @ 15:00:00 | Stop: 2021-06-21 | NDC 00832532511

## 2021-06-21 MED ADMIN — TRAMADOL 50 MG PO TAB [14632]: 50 mg | ORAL | @ 10:00:00 | NDC 00904717961

## 2021-06-21 MED ADMIN — NALOXEGOL 12.5 MG PO TAB [324272]: 25 mg | ORAL | @ 13:00:00 | NDC 57841130001

## 2021-06-21 MED ADMIN — FAMOTIDINE 20 MG PO TAB [10011]: 20 mg | ORAL | @ 15:00:00 | NDC 63739064510

## 2021-06-21 MED ADMIN — ACETAMINOPHEN 500 MG PO TAB [102]: 1000 mg | ORAL | @ 03:00:00 | Stop: 2021-06-23 | NDC 00904673061

## 2021-06-21 MED ADMIN — CELECOXIB 200 MG PO CAP [76958]: 200 mg | ORAL | @ 03:00:00 | Stop: 2021-06-24 | NDC 00904650361

## 2021-06-21 MED ADMIN — CELECOXIB 200 MG PO CAP [76958]: 200 mg | ORAL | @ 15:00:00 | Stop: 2021-06-24 | NDC 00904650361

## 2021-06-21 MED ADMIN — GABAPENTIN 300 MG PO CAP [18308]: 300 mg | ORAL | Stop: 2021-06-23 | NDC 00904666661

## 2021-06-21 MED ADMIN — TRAMADOL 50 MG PO TAB [14632]: 50 mg | ORAL | @ 03:00:00 | NDC 00904717961

## 2021-06-21 MED ADMIN — CEFAZOLIN INJ 1GM IVP [210319]: 2 g | INTRAVENOUS | Stop: 2021-06-21 | NDC 60505614200

## 2021-06-21 MED ADMIN — POLYETHYLENE GLYCOL 3350 17 GRAM PO PWPK [25424]: 17 g | ORAL | @ 03:00:00 | NDC 00904693186

## 2021-06-21 MED ADMIN — POLYETHYLENE GLYCOL 3350 17 GRAM PO PWPK [25424]: 17 g | ORAL | @ 15:00:00 | NDC 00904693186

## 2021-06-21 MED ADMIN — ACETAMINOPHEN 500 MG PO TAB [102]: 1000 mg | ORAL | @ 13:00:00 | Stop: 2021-06-23 | NDC 00904673061

## 2021-06-21 MED ADMIN — GABAPENTIN 300 MG PO CAP [18308]: 300 mg | ORAL | @ 21:00:00 | Stop: 2021-06-23 | NDC 00904666661

## 2021-06-21 MED ADMIN — PILOCARPINE HCL 5 MG PO TAB [12803]: 5 mg | ORAL | @ 16:00:00 | NDC 68084092895

## 2021-06-21 MED ADMIN — FAMOTIDINE 20 MG PO TAB [10011]: 20 mg | ORAL | @ 03:00:00 | NDC 63739064510

## 2021-06-22 ENCOUNTER — Encounter: Admit: 2021-06-22 | Discharge: 2021-06-22 | Payer: BC Managed Care – PPO

## 2021-06-22 DIAGNOSIS — B999 Unspecified infectious disease: Secondary | ICD-10-CM

## 2021-06-22 DIAGNOSIS — M359 Systemic involvement of connective tissue, unspecified: Secondary | ICD-10-CM

## 2021-06-22 DIAGNOSIS — T8859XA Other complications of anesthesia, initial encounter: Secondary | ICD-10-CM

## 2021-06-22 DIAGNOSIS — M1990 Unspecified osteoarthritis, unspecified site: Secondary | ICD-10-CM

## 2021-06-22 MED ADMIN — DOCUSATE SODIUM 100 MG PO CAP [2566]: 200 mg | ORAL | @ 03:00:00 | NDC 00904718361

## 2021-06-22 MED ADMIN — TRAMADOL 50 MG PO TAB [14632]: 50 mg | ORAL | @ 09:00:00 | NDC 00904717961

## 2021-06-22 MED ADMIN — TRAMADOL 50 MG PO TAB [14632]: 50 mg | ORAL | @ 21:00:00 | NDC 00904717961

## 2021-06-22 MED ADMIN — DOCUSATE SODIUM 100 MG PO CAP [2566]: 200 mg | ORAL | @ 15:00:00 | NDC 00904718361

## 2021-06-22 MED ADMIN — ACETAMINOPHEN 500 MG PO TAB [102]: 1000 mg | ORAL | @ 21:00:00 | Stop: 2021-06-23 | NDC 00904673061

## 2021-06-22 MED ADMIN — AMITRIPTYLINE 25 MG PO TAB [435]: 25 mg | ORAL | @ 03:00:00 | NDC 00904718461

## 2021-06-22 MED ADMIN — FAMOTIDINE 20 MG PO TAB [10011]: 20 mg | ORAL | @ 03:00:00 | NDC 63739064510

## 2021-06-22 MED ADMIN — GABAPENTIN 300 MG PO CAP [18308]: 300 mg | ORAL | @ 21:00:00 | Stop: 2021-06-23 | NDC 00904666661

## 2021-06-22 MED ADMIN — TRAMADOL 50 MG PO TAB [14632]: 50 mg | ORAL | @ 15:00:00 | NDC 00904717961

## 2021-06-22 MED ADMIN — PILOCARPINE HCL 5 MG PO TAB [12803]: 5 mg | ORAL | @ 16:00:00 | NDC 68084092895

## 2021-06-22 MED ADMIN — NALOXEGOL 12.5 MG PO TAB [324272]: 25 mg | ORAL | @ 13:00:00 | NDC 57841130001

## 2021-06-22 MED ADMIN — CELECOXIB 200 MG PO CAP [76958]: 200 mg | ORAL | @ 03:00:00 | Stop: 2021-06-24 | NDC 00904650361

## 2021-06-22 MED ADMIN — ACETAMINOPHEN 500 MG PO TAB [102]: 1000 mg | ORAL | @ 03:00:00 | Stop: 2021-06-23 | NDC 00904673061

## 2021-06-22 MED ADMIN — PILOCARPINE HCL 5 MG PO TAB [12803]: 5 mg | ORAL | @ 21:00:00 | NDC 68084092895

## 2021-06-22 MED ADMIN — PILOCARPINE HCL 5 MG PO TAB [12803]: 5 mg | ORAL | @ 04:00:00 | NDC 68084092895

## 2021-06-22 MED ADMIN — METHOCARBAMOL 750 MG PO TAB [4972]: 750 mg | ORAL | @ 03:00:00 | NDC 70010077005

## 2021-06-22 MED ADMIN — POLYETHYLENE GLYCOL 3350 17 GRAM PO PWPK [25424]: 17 g | ORAL | @ 15:00:00 | NDC 00904693186

## 2021-06-22 MED ADMIN — TRAMADOL 50 MG PO TAB [14632]: 50 mg | ORAL | @ 03:00:00 | NDC 00904717961

## 2021-06-22 MED ADMIN — PILOCARPINE HCL 5 MG PO TAB [12803]: 5 mg | ORAL | @ 01:00:00 | NDC 68084092895

## 2021-06-22 MED ADMIN — ENOXAPARIN 40 MG/0.4 ML SC SYRG [85052]: 40 mg | SUBCUTANEOUS | @ 03:00:00 | NDC 00781324602

## 2021-06-22 MED ADMIN — FAMOTIDINE 20 MG PO TAB [10011]: 20 mg | ORAL | @ 15:00:00 | NDC 63739064510

## 2021-06-22 MED ADMIN — ACETAMINOPHEN 500 MG PO TAB [102]: 1000 mg | ORAL | @ 13:00:00 | Stop: 2021-06-23 | NDC 00904673061

## 2021-06-22 MED ADMIN — METHOCARBAMOL 750 MG PO TAB [4972]: 750 mg | ORAL | @ 15:00:00 | NDC 70010077005

## 2021-06-22 MED ADMIN — GABAPENTIN 300 MG PO CAP [18308]: 300 mg | ORAL | @ 15:00:00 | Stop: 2021-06-23 | NDC 00904666661

## 2021-06-22 MED ADMIN — CELECOXIB 200 MG PO CAP [76958]: 200 mg | ORAL | @ 15:00:00 | Stop: 2021-06-24 | NDC 00904650361

## 2021-06-22 MED ADMIN — GABAPENTIN 300 MG PO CAP [18308]: 300 mg | ORAL | @ 03:00:00 | Stop: 2021-06-23 | NDC 00904666661

## 2021-06-22 MED FILL — TRAMADOL 50 MG PO TAB: 50 mg | ORAL | 7 days supply | Qty: 21 | Fill #1 | Status: AC

## 2021-06-23 MED ADMIN — CELECOXIB 200 MG PO CAP [76958]: 200 mg | ORAL | @ 03:00:00 | Stop: 2021-06-24 | NDC 00904650361

## 2021-06-23 MED ADMIN — PILOCARPINE HCL 5 MG PO TAB [12803]: 5 mg | ORAL | @ 19:00:00 | NDC 68084092895

## 2021-06-23 MED ADMIN — ACETAMINOPHEN 500 MG PO TAB [102]: 1000 mg | ORAL | @ 12:00:00 | Stop: 2021-06-23 | NDC 00904673061

## 2021-06-23 MED ADMIN — FAMOTIDINE 20 MG PO TAB [10011]: 20 mg | ORAL | @ 14:00:00 | NDC 63739064510

## 2021-06-23 MED ADMIN — AMITRIPTYLINE 25 MG PO TAB [435]: 25 mg | ORAL | @ 05:00:00 | NDC 00904718461

## 2021-06-23 MED ADMIN — CELECOXIB 200 MG PO CAP [76958]: 200 mg | ORAL | @ 14:00:00 | Stop: 2021-06-23 | NDC 00904650361

## 2021-06-23 MED ADMIN — PILOCARPINE HCL 5 MG PO TAB [12803]: 5 mg | ORAL | @ 03:00:00 | NDC 68084092895

## 2021-06-23 MED ADMIN — POLYETHYLENE GLYCOL 3350 17 GRAM PO PWPK [25424]: 17 g | ORAL | NDC 00904693186

## 2021-06-23 MED ADMIN — TRAMADOL 50 MG PO TAB [14632]: 50 mg | ORAL | @ 12:00:00 | NDC 00904717961

## 2021-06-23 MED ADMIN — ENOXAPARIN 40 MG/0.4 ML SC SYRG [85052]: 40 mg | SUBCUTANEOUS | @ 03:00:00 | NDC 00781324602

## 2021-06-23 MED ADMIN — GABAPENTIN 300 MG PO CAP [18308]: 300 mg | ORAL | @ 03:00:00 | Stop: 2021-06-23 | NDC 00904666661

## 2021-06-23 MED ADMIN — DOCUSATE SODIUM 100 MG PO CAP [2566]: 200 mg | ORAL | @ 03:00:00 | NDC 00904718361

## 2021-06-23 MED ADMIN — PILOCARPINE HCL 5 MG PO TAB [12803]: 5 mg | ORAL | NDC 68084092895

## 2021-06-23 MED ADMIN — GABAPENTIN 300 MG PO CAP [18308]: 300 mg | ORAL | @ 14:00:00 | Stop: 2021-06-23 | NDC 00904666661

## 2021-06-23 MED ADMIN — PILOCARPINE HCL 5 MG PO TAB [12803]: 5 mg | ORAL | @ 16:00:00 | NDC 68084092895

## 2021-06-23 MED ADMIN — METHOCARBAMOL 750 MG PO TAB [4972]: 750 mg | ORAL | @ 14:00:00 | NDC 70010077005

## 2021-06-23 MED ADMIN — DOCUSATE SODIUM 100 MG PO CAP [2566]: 200 mg | ORAL | @ 14:00:00 | NDC 00904718361

## 2021-06-23 MED ADMIN — TRAMADOL 50 MG PO TAB [14632]: 50 mg | ORAL | @ 20:00:00 | NDC 00904717961

## 2021-06-23 MED ADMIN — SCOPOLAMINE BASE 1 MG OVER 3 DAYS TD PT3D [82141]: 1 | TRANSDERMAL | @ 14:00:00 | NDC 50742050501

## 2021-06-23 MED ADMIN — POLYETHYLENE GLYCOL 3350 17 GRAM PO PWPK [25424]: 17 g | ORAL | @ 14:00:00 | NDC 00904693186

## 2021-06-23 MED ADMIN — TRAMADOL 50 MG PO TAB [14632]: 50 mg | ORAL | @ 05:00:00 | NDC 00904717961

## 2021-06-23 MED ADMIN — FAMOTIDINE 20 MG PO TAB [10011]: 20 mg | ORAL | @ 03:00:00 | NDC 63739064510

## 2021-06-23 MED ADMIN — ARTIFICIAL TEARS (PF) SINGLE DOSE DROPS GROUP [280009]: 1 [drp] | OPHTHALMIC | @ 10:00:00 | NDC 00065806301

## 2021-06-23 MED ADMIN — METHOCARBAMOL 750 MG PO TAB [4972]: 750 mg | ORAL | @ 03:00:00 | NDC 70010077005

## 2021-06-23 MED ADMIN — ACETAMINOPHEN 500 MG PO TAB [102]: 1000 mg | ORAL | @ 05:00:00 | Stop: 2021-06-23 | NDC 00904673061

## 2021-06-23 MED ADMIN — NALOXEGOL 12.5 MG PO TAB [324272]: 25 mg | ORAL | @ 12:00:00 | NDC 57841130001

## 2021-06-24 MED ADMIN — POLYETHYLENE GLYCOL 3350 17 GRAM PO PWPK [25424]: 17 g | ORAL | @ 15:00:00 | Stop: 2021-06-24 | NDC 00904693186

## 2021-06-24 MED ADMIN — TRAMADOL 50 MG PO TAB [14632]: 50 mg | ORAL | @ 16:00:00 | Stop: 2021-06-24 | NDC 00904717961

## 2021-06-24 MED ADMIN — PILOCARPINE HCL 5 MG PO TAB [12803]: 5 mg | ORAL | @ 15:00:00 | Stop: 2021-06-24 | NDC 68084092895

## 2021-06-24 MED ADMIN — FAMOTIDINE 20 MG PO TAB [10011]: 20 mg | ORAL | @ 03:00:00 | NDC 63739064510

## 2021-06-24 MED ADMIN — ENOXAPARIN 40 MG/0.4 ML SC SYRG [85052]: 40 mg | SUBCUTANEOUS | @ 03:00:00 | NDC 00781324602

## 2021-06-24 MED ADMIN — AMITRIPTYLINE 25 MG PO TAB [435]: 25 mg | ORAL | @ 07:00:00 | Stop: 2021-06-24 | NDC 00904718461

## 2021-06-24 MED ADMIN — FAMOTIDINE 20 MG PO TAB [10011]: 20 mg | ORAL | @ 15:00:00 | Stop: 2021-06-24 | NDC 63739064510

## 2021-06-24 MED ADMIN — NALOXEGOL 12.5 MG PO TAB [324272]: 25 mg | ORAL | @ 12:00:00 | Stop: 2021-06-24 | NDC 57841130001

## 2021-06-24 MED ADMIN — ACETAMINOPHEN 500 MG PO TAB [102]: 1000 mg | ORAL | @ 07:00:00 | Stop: 2021-06-24 | NDC 00904673061

## 2021-06-24 MED ADMIN — DOCUSATE SODIUM 100 MG PO CAP [2566]: 200 mg | ORAL | @ 15:00:00 | Stop: 2021-06-24 | NDC 00904718361

## 2021-06-24 MED ADMIN — PILOCARPINE HCL 5 MG PO TAB [12803]: 5 mg | ORAL | @ 03:00:00 | NDC 68084092895

## 2021-06-24 MED ADMIN — DOCUSATE SODIUM 100 MG PO CAP [2566]: 200 mg | ORAL | @ 03:00:00 | NDC 00904718361

## 2021-06-24 MED ADMIN — METHOCARBAMOL 750 MG PO TAB [4972]: 750 mg | ORAL | @ 15:00:00 | Stop: 2021-06-24 | NDC 70010077005

## 2021-06-24 MED ADMIN — TRAMADOL 50 MG PO TAB [14632]: 50 mg | ORAL | @ 03:00:00 | NDC 00904717961

## 2021-06-24 MED ADMIN — METHOCARBAMOL 750 MG PO TAB [4972]: 750 mg | ORAL | @ 03:00:00 | NDC 70010077005

## 2021-07-11 NOTE — Progress Notes
..  This prechart is intended to be a reference for patient appointments. Information is gathered from in chart as well as external records review. Information will be clarified/verified/updated in final documentation in the office visit.     Pre Clinic Pre Chart:    Janice Gregory is a 61 year old female with history of sicca syndrome, possible Sjogren's, chronic constipation, fibromyalgia, and multiple pelvic surgeries: traumatic SVD '81 requiring primary repair, ex-lap/appendectomy after hysterectomy '97, A/P vaginal repair '00, rectocele repair/rectopexy/sphincter repair/transvaginal sling '04 for rectal prolapse, excisional hemorrhoidectomy '09, Delorme procedure 07/10/16 for recurrent prolapse, and excisional hemorrhoidectomy again 08/07/16.Due to significant fecal incontinence she underwent a laparoscopic lysis of adhesions 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  She has subsequently developed a symptomatic parastomal hernia - "soreness" and intermittent obstruction. She elected to undergo a laparoscopic lysis of adhesions and robotic parastomal hernia repair with mesh (modified Sugarbaker) on 06/20/21.     Problem List:      06/20/2021 - 1) Laparoscopic lysis of adhesions 2) Robotic parastomal hernia repair with mesh (modified Sugarbaker)  Findings:  15cm round [facility] [employer] ST, well positioned, colostomy well vascularized at conclusion

## 2021-07-12 ENCOUNTER — Encounter: Admit: 2021-07-12 | Discharge: 2021-07-12 | Payer: BC Managed Care – PPO

## 2021-07-12 DIAGNOSIS — M359 Systemic involvement of connective tissue, unspecified: Secondary | ICD-10-CM

## 2021-07-12 DIAGNOSIS — M1990 Unspecified osteoarthritis, unspecified site: Secondary | ICD-10-CM

## 2021-07-12 DIAGNOSIS — K435 Parastomal hernia without obstruction or  gangrene: Secondary | ICD-10-CM

## 2021-07-12 DIAGNOSIS — R110 Nausea: Secondary | ICD-10-CM

## 2021-07-12 DIAGNOSIS — B999 Unspecified infectious disease: Secondary | ICD-10-CM

## 2021-07-12 DIAGNOSIS — T8859XA Other complications of anesthesia, initial encounter: Secondary | ICD-10-CM

## 2021-07-12 MED ORDER — GABAPENTIN 300 MG PO CAP
300 mg | ORAL_CAPSULE | Freq: Two times a day (BID) | ORAL | 0 refills | Status: AC
Start: 2021-07-12 — End: ?

## 2021-07-12 MED ORDER — ONDANSETRON HCL 4 MG PO TAB
4 mg | ORAL_TABLET | ORAL | 0 refills | 8.00000 days | Status: AC | PRN
Start: 2021-07-12 — End: ?

## 2021-07-12 NOTE — Progress Notes
Name: Janice Gregory          MRN: [id number]      DOB: [birth year]      AGE: 61 y.o.   DATE OF SERVICE: 07/12/2021    Subjective:             Reason for Visit:  Follow Up      Janice Gregory is a 61 y.o. female.      Cancer Staging   No matching staging information was found for the patient.    History of Present Illness  Mckenize is a 61 year old female with history of sicca syndrome, possible Sjogren's, chronic constipation, fibromyalgia, and multiple pelvic surgeries: traumatic SVD '81 requiring primary repair, ex-lap/appendectomy after hysterectomy '97, A/P vaginal repair '00, rectocele repair/rectopexy/sphincter repair/transvaginal sling '04 for rectal prolapse, excisional hemorrhoidectomy '09, Delorme procedure 07/10/16 for recurrent prolapse, and excisional hemorrhoidectomy again 08/07/16.??Due to significant fecal incontinence she underwent a laparoscopic lysis of adhesions 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  She has subsequently developed a symptomatic parastomal hernia - soreness and intermittent obstruction. She elected to undergo a laparoscopic lysis of adhesions and robotic parastomal hernia repair with mesh (modified Sugarbaker) on 06/20/21.     She returns to clinic today postoperatively.  She has been struggling with pain.  The tramadol at times did not cut her pain so she talked with her primary care provider who gave her some Norco.  She has been trying not to use this.  Does not even need to take 1 once a day.  She has not been taking any ibuprofen. She does still complain that pain is worse with activity.  Can be described as burning or shooting.  Felt like it was better in the hospital.  Does take amitriptyline.  Did not go home with any Robaxin.   The stoma yesterday has now receded.  They were getting about 7 days out of a bag but just yesterday it did not last that long.  They are using a convex bag as well as a paste ring.  The stool was almost pancake does well when it leaked.  She has been eating but has had some nausea.  They sent her home with Pepcid but she has had Zofran in the past.  She is back home now and working with outpatient PT.  They asked her to work on desensitizing her lower abdomen by rubbing of other over this.  Other incisions are healing well without any redness or drainage.    06/20/2021 - 1) Laparoscopic lysis of adhesions 2) Robotic parastomal hernia repair with mesh (modified Sugarbaker)  Findings:  15cm round [facility] Phasix ST, well positioned, colostomy well vascularized at conclusion    Medical History:   Diagnosis Date   ? Arthritis    ? Complication of anesthesia    ? Connective tissue disease (HCC)    ? Infection      Surgical History:   Procedure Laterality Date   ? ANORECTAL MANOMETRY N/A 11/11/2019    Performed by Eliott Nine, MD at [facility] ENDO   ? LAPAROSCOPIC COLOSTOMY N/A 12/15/2019    Performed by Benetta Spar, MD at [facility] OR   ? ROBOT ASSISTED REPAIR PARASTOMAL HERNIA WITH MESH N/A 06/20/2021    Performed by Benetta Spar, MD at [facility] OR   ? ARTHROPLASTY     ? FOOT SURGERY     ? HERNIA REPAIR     ? HX APPENDECTOMY     ? HX CHOLECYSTECTOMY     ?  HX HEMORRHOIDECTOMY     ? HX HYSTERECTOMY     ? HX JOINT REPLACEMENT     ? HYSTERECTOMY     ? KNEE SURGERY       Family History   Problem Relation Age of Onset   ? Osteoporosis Mother    ? Arthritis Mother    ? Heart Attack Father    ? Osteoporosis Sibling    ? Arthritis Sibling    ? Scoliosis Sibling      Social History     Socioeconomic History   ? Marital status: Married   Tobacco Use   ? Smoking status: Former     Types: Cigarettes     Quit date: 05/08/1977     Years since quitting: 44.2   ? Smokeless tobacco: Never   Vaping Use   ? Vaping Use: Never used   Substance and Sexual Activity   ? Alcohol use: Yes     Comment: rarely   ? Drug use: Never   ? Sexual activity: Not Currently     Partners: Male     Vaping/E-liquid Use   ? Vaping Use Never User                      Review of Systems   Constitutional: Negative. Negative for chills, fever and unexpected weight change.   HENT: Negative.    Eyes: Negative.    Respiratory: Negative.    Cardiovascular: Negative.    Gastrointestinal: Negative.  Negative for abdominal distention, abdominal pain, anal bleeding, blood in stool, constipation, diarrhea, nausea, rectal pain and vomiting.   Endocrine: Negative.    Genitourinary: Negative.    Musculoskeletal: Negative.    Skin: Negative.  Negative for color change and wound.   Allergic/Immunologic: Negative.    Neurological: Positive for weakness. Negative for light-headedness.   Hematological: Negative.    Psychiatric/Behavioral: Negative.    All other systems reviewed and are negative.        Objective:         ? acetaminophen (TYLENOL EXTRA STRENGTH) 500 mg tablet Take two tablets by mouth every 6 hours as needed. Max of 4,000 mg of acetaminophen in 24 hours.   ? albuterol sulfate (PROAIR HFA) 90 mcg/actuation HFA aerosol inhaler Inhale two puffs by mouth into the lungs every 4 hours as needed. Shake well before use.   ? amitriptyline (ELAVIL) 25 mg tablet Take 1 tablet by mouth daily at bedtime with 50 mg tablet for a total of 75 mg daily at bedtime. (Patient taking differently: Take 25 mg by mouth at bedtime as needed for Pain.)   ? amitriptyline (ELAVIL) 50 mg tablet Take one tablet by mouth at bedtime daily.   ? COLLAGEN MISC Take 1 capsule by mouth daily.   ? docusate sodium (COLACE PO) Take 200 mg by mouth twice daily.   ? estradioL (ESTRACE) 0.01 % (0.1 mg/g) vaginal cream Insert or Apply one g to vaginal area three times weekly.   ? famotidine (PEPCID) 20 mg tablet Take one tablet by mouth twice daily.   ? hydrocortisone (HYTONE) 2.5 % topical cream Apply  topically to affected area twice daily. (Patient taking differently: Apply  topically to affected area twice daily as needed.)   ? ibuprofen (ADVIL) 200 mg tablet Take 200-400 mg by mouth every 6 hours as needed. Take with food.   ? Lactobac no.41/Bifidobact no.7 (PROBIOTIC-10 PO) Take 1 capsule by mouth daily. 20 Billion    ?  methocarbamoL (ROBAXIN) 750 mg tablet Take one tablet by mouth twice daily.   ? nystatin (MYCOSTATIN) 100,000 unit/g topical cream Apply  topically to affected area twice daily as needed (Rash).   ? nystatin-triamcinolone 100,000 unit/g / 0.1 % topical cream Apply  topically to affected area four times daily.   ? ondansetron (ZOFRAN) 4 mg/2 mL soln Administer 2 mL through vein every 6 hours as needed.   ? phenazopyridine (PYRIDIUM) 200 mg tablet Take one tablet by mouth three times daily as needed for Pain. Take after meals for up to 2 days.   ? pilocarpine HCL (SALAGEN) 5 mg tablet Take 5 mg by mouth four times daily.   ? polyethylene glycol 3350 (MIRALAX) 17 g packet Take 17 g by mouth twice daily.   ? safflower oil/linoleic acid,co (CLA PO) Take 1,300 mg by mouth twice daily.   ? simethicone (GAS-X PO) Take 2 tablets by mouth at bedtime daily.   ? sodium chloride (MURO-5) 5 % ophthalmic solution Apply 1 drop to both eyes as Needed.   ? traMADoL (ULTRAM) 50 mg tablet Take one tablet by mouth every 6 hours as needed.     Vitals:    07/12/21 0915 07/12/21 0922   BP:  (!) 142/92   BP Source:  Arm, Left Upper   Pulse:  83   Temp:  36.7 ?C (98.1 ?F)   Resp:  16   SpO2:  100%   O2 Device:  None (Room air)   TempSrc:  Temporal   PainSc: Zero    Weight:  75.7 kg (166 lb 12.8 oz)     Body mass index is 24.63 kg/m?Marland Kitchen     Pain Score: Zero       Fatigue Scale: 8     Physical Exam  Vitals reviewed.   Constitutional:       General: She is not in acute distress.     Appearance: Normal appearance. She is well-developed.   HENT:      Head: Normocephalic and atraumatic.      Nose: Nose normal.   Eyes:      General: Lids are normal.      Conjunctiva/sclera: Conjunctivae normal.   Pulmonary:      Effort: Pulmonary effort is normal. No respiratory distress.   Abdominal:          Comments: Well healed incisions.   Musculoskeletal:         General: Normal range of motion. Cervical back: Normal range of motion.   Neurological:      Mental Status: She is alert and oriented to person, place, and time.   Psychiatric:         Speech: Speech normal.         Behavior: Behavior normal.         Thought Content: Thought content normal.         Judgment: Judgment normal.               Assessment and Plan:  61yo female s/p laparoscopic lysis of adhesions and robotic parastomal hernia repair with mesh (modified Sugarbaker) on 06/20/21.  Struggling with pain.     Post-Operative Care:  - Reviewed that the patient has a 10lb weightlifting restriction for 10-12wks from the date of surgery. I warned the patient of the risk of hernia if lifting too soon. However, encouraged aerobic activity. No core exercises.  - Discussed pain management strategies at this point post operatively. Tylenol, ibuprofen, and a heating pad.   -  She will start 600mg  of ibuprofen TID. If by Thursday this has not been helpful she will pick up and trial 2wks of Gabapentin 300mg  BID. If not improvement with Gabapentin consider change to Robaxin.  - Wound care reviewed.  - Bowel habits change reviewed. Resume probiotic. Continue 2 coalce BID and 1 Miralax BID. If taking Norco add another 1/2-1cap of Miralax.    Receded Stoma:  - Could be do to swelling around the site due to recent surgery. Hopefully will improve with time. For now continue with convex bag and paste ring. This will help.     Deconditioning:  - Continue to work with PT and OT.    Plan:  - RTC 3wks APP telehealth.  - RTC 8wks Dr. Daphine Deutscher telehealth.     .The patient and family were allowed to ask questions and voice concerns; these were addressed to the best of our ability. They expressed understanding of what was explained to them, and they agreed with the present plan. Patient has the phone numbers for the Cancer Center and was instructed on how to contact us with any questions or concerns.    My collaborating provider on this patient is Dr. Deedra Ehrich MD.    Brunilda Payor. Fredricka Bonine MSN, APRN, AGCNS-BC, AGPCNP-BC  Advanced Practice Provider  Colon and Rectal Surgery  Surgical Oncology  APP for Dr. Kathee Delton, Dr. Daphine Deutscher and Dr. Daiva Nakayama  Pager: [phone]  Available via Amie Critchley and [facility] Connect

## 2021-07-13 ENCOUNTER — Encounter: Admit: 2021-07-13 | Discharge: 2021-07-13 | Payer: BC Managed Care – PPO

## 2021-07-22 ENCOUNTER — Encounter: Admit: 2021-07-22 | Discharge: 2021-07-22 | Payer: BC Managed Care – PPO

## 2021-07-26 ENCOUNTER — Encounter: Admit: 2021-07-26 | Discharge: 2021-07-26 | Payer: BC Managed Care – PPO

## 2021-07-26 ENCOUNTER — Ambulatory Visit: Admit: 2021-07-26 | Discharge: 2021-07-27 | Payer: BC Managed Care – PPO

## 2021-07-26 DIAGNOSIS — N3020 Other chronic cystitis without hematuria: Secondary | ICD-10-CM

## 2021-07-26 DIAGNOSIS — M1990 Unspecified osteoarthritis, unspecified site: Secondary | ICD-10-CM

## 2021-07-26 DIAGNOSIS — B999 Unspecified infectious disease: Secondary | ICD-10-CM

## 2021-07-26 DIAGNOSIS — M359 Systemic involvement of connective tissue, unspecified: Secondary | ICD-10-CM

## 2021-07-26 DIAGNOSIS — T8859XA Other complications of anesthesia, initial encounter: Secondary | ICD-10-CM

## 2021-07-26 NOTE — Progress Notes
Telehealth Visit Note    Date of Service: 07/26/2021    History of Present Illness    Janice Gregory is a 61 y.o. female seen in follow up for history of recurrent UTI.    My first visit with her was on May 19, 2021.  See my full note on that day for details.  Her urine testing on that date was negative for UTI, no pyuria.      She has parastomal hernia surgery recently on June 20, 2021.    Once cleared she will be able to resume bladder instillations with Urogynecology per her report.   She has no new concerns regarding urinary symptoms since our last visit in January.      No fever or chills.   No dysuria or incomplete voiding.  Has some hesitancy but is able to void after bladder spasm symptoms subside.  She doesn't know if she has ever trialed levsin for the spasms.          Review of Systems  A 12 point comprehensive ROS was obtained and was otherwise negative except as noted per HPI.     .  Objective:         ? acetaminophen (TYLENOL EXTRA STRENGTH) 500 mg tablet Take two tablets by mouth every 6 hours as needed. Max of 4,000 mg of acetaminophen in 24 hours.   ? albuterol sulfate (PROAIR HFA) 90 mcg/actuation HFA aerosol inhaler Inhale two puffs by mouth into the lungs every 4 hours as needed. Shake well before use.   ? amitriptyline (ELAVIL) 25 mg tablet Take 1 tablet by mouth daily at bedtime with 50 mg tablet for a total of 75 mg daily at bedtime. (Patient taking differently: Take one tablet by mouth at bedtime as needed for Pain.)   ? amitriptyline (ELAVIL) 50 mg tablet Take one tablet by mouth at bedtime daily.   ? COLLAGEN MISC Take 1 capsule by mouth daily.   ? docusate sodium (COLACE PO) Take 200 mg by mouth twice daily.   ? estradioL (ESTRACE) 0.01 % (0.1 mg/g) vaginal cream Insert or Apply one g to vaginal area three times weekly.   ? famotidine (PEPCID) 20 mg tablet Take one tablet by mouth twice daily.   ? gabapentin (NEURONTIN) 300 mg capsule Take one capsule by mouth twice daily for 14 days.   ? hydrocortisone (HYTONE) 2.5 % topical cream Apply  topically to affected area twice daily. (Patient taking differently: Apply  topically to affected area twice daily as needed.)   ? ibuprofen (ADVIL) 200 mg tablet Take one tablet to two tablets by mouth every 6 hours as needed. Take with food.   ? Lactobac no.41/Bifidobact no.7 (PROBIOTIC-10 PO) Take 1 capsule by mouth daily. 20 Billion    ? methocarbamoL (ROBAXIN) 750 mg tablet Take one tablet by mouth twice daily.   ? nystatin (MYCOSTATIN) 100,000 unit/g topical cream Apply  topically to affected area twice daily as needed (Rash).   ? nystatin-triamcinolone 100,000 unit/g / 0.1 % topical cream Apply  topically to affected area four times daily.   ? ondansetron (ZOFRAN) 4 mg/2 mL soln Administer 2 mL through vein every 6 hours as needed.   ? ondansetron HCL (ZOFRAN) 4 mg tablet Take one tablet by mouth every 8 hours as needed for Nausea or Vomiting.   ? phenazopyridine (PYRIDIUM) 200 mg tablet Take one tablet by mouth three times daily as needed for Pain. Take after meals for up to 2 days.   ?  pilocarpine HCL (SALAGEN) 5 mg tablet Take one tablet by mouth four times daily.   ? polyethylene glycol 3350 (MIRALAX) 17 g packet Take one packet by mouth twice daily.   ? safflower oil/linoleic acid,co (CLA PO) Take 1,300 mg by mouth twice daily.   ? simethicone (GAS-X PO) Take 2 tablets by mouth at bedtime daily.   ? sodium chloride (MURO-5) 5 % ophthalmic solution Apply one drop to both eyes as Needed.   ? traMADoL (ULTRAM) 50 mg tablet Take one tablet by mouth every 6 hours as needed.          Telehealth Patient Reported Vitals     Row Name 07/26/21 1325                Pain Score: SIX        Pain Location: LEG  bilateral upper                   Computed Telehealth Body Mass Index unavailable. Necessary lab results were not found in the last year.    Physical Exam  General appearance: alert, oriented x 3, in NAD  HENT: mucus membranes moist, no perioral lesions  Eyes: EOM grossly intact, no scleral icterus   Lungs: respirations non-labored   Ext:  No upper extremity cyanosis or edema  Skin: no rashes on arms, face or neck   Neuro: no focal motor deficits         Assessment:    Chronic cystitis   Recurrent UTI history   - 03/15/2021 cystoscopy showed bladder inflammation with petechiae. ?There was chronic infection demonstrated with cystitis cystica with the appearance of lymphocytic infiltrates over 100 lesions. ?  - 03/15/2021 urodynamic studies showed evidence of retention  - starting in November treated with Ceftin and Keflex chronically - pt didn't think it was helping   - no difference with weekly bladder instillations comprised of bicarbonate, heparin, lidocaine, Solu-Cortef, and normal saline, most recently 05/05/2021  - not taking D-mannose  - using topical estrogen triweekly   - 05/06/2021 UA showed 6-10 wbc's with 2+ bacteria, 1+ leukocyte esterase, negative nitrite, culture grew 50 to 100,000 CFUs of Pseudomonas aeruginosa with resistance to Levaquin, intermediate to ciprofloxacin, ceftazidime, and cefepime and sensitive to gentamicin, imipenem, and tobramycin. ?  - 05/12/2021 UA showed 0-2 wbc's with negative nitrites and leukocytes  - no new symptoms concerning for UTI   ?  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  Hx of bilateral posterior buccal mucosa ulcerations, painful  - recurrent per the patient  - Jan 2023 swab negative for HSV PCR  ?  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  Unclear history of Sjogren's syndrome/Sicca syndrome       Plan:    1. Monitor off of antibiotic therapy.  2. Consider trial of hyoscyamine if appropriate for bladder spasms, patient to discuss with Urology/Urogynecology  at next visit with them.    RTC with ID as needed.     Mora Bellman, MD  Division of Infectious Diseases  [facility]

## 2021-07-28 NOTE — Progress Notes
..  This prechart is intended to be a reference for patient appointments. Information is gathered from in chart as well as external records review. Information will be clarified/verified/updated in final documentation in the office visit.     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  She has subsequently developed a symptomatic parastomal hernia - "soreness" and intermittent obstruction. She elected to undergo a laparoscopic lysis of adhesions and robotic parastomal hernia repair with mesh (modified Sugarbaker) on 06/20/21.     Problem List:      06/20/2021 - 1) Laparoscopic lysis of adhesions 2) Robotic parastomal hernia repair with mesh (modified Sugarbaker)  Findings:  15cm round [facility] ST, well positioned, colostomy well vascularized at conclusion

## 2021-08-03 ENCOUNTER — Encounter: Admit: 2021-08-03 | Discharge: 2021-08-03 | Payer: BC Managed Care – PPO

## 2021-08-03 DIAGNOSIS — T8859XA Other complications of anesthesia, initial encounter: Secondary | ICD-10-CM

## 2021-08-03 DIAGNOSIS — M359 Systemic involvement of connective tissue, unspecified: Secondary | ICD-10-CM

## 2021-08-03 DIAGNOSIS — B999 Unspecified infectious disease: Secondary | ICD-10-CM

## 2021-08-03 DIAGNOSIS — M1990 Unspecified osteoarthritis, unspecified site: Secondary | ICD-10-CM

## 2021-08-03 DIAGNOSIS — K435 Parastomal hernia without obstruction or  gangrene: Secondary | ICD-10-CM

## 2021-08-03 NOTE — Progress Notes
Telehealth Visit Note    Date of Service: 08/03/2021    Subjective:           Janice Gregory is a 61 y.o. female.    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  She has subsequently developed a symptomatic parastomal hernia - soreness and intermittent obstruction. She elected to undergo a laparoscopic lysis of adhesions and robotic parastomal hernia repair with mesh (modified Sugarbaker) on 06/20/21.     She returns to clinic today for follow-up.  She feels that the pain is improved.  She is been using gabapentin at night.  She continues to have some urges and passes mucus per her rectum.  Sometimes she feels that she has to strain to evacuate this.  Continues to struggle with the stoma intermittently retracting.  This has been making it hard to keep the bag on.  She has been unhappy with [facility] and how helpful they have been.  She was able to find some relief sticky spray that has allowed the back to stay on for 3 to 5 days.  She is wondering if this can be ordered through [facility].  The stool itself has been moving well.  Continues on her regimen of 1 MiraLAX twice a day and 2 Colace twice a day.    06/20/2021 - 1) Laparoscopic lysis of adhesions 2) Robotic parastomal hernia repair with mesh (modified Sugarbaker)  Findings:  15cm round [facility] ST, well positioned, colostomy well vascularized at conclusion    Medical History:   Diagnosis Date   ? Arthritis    ? Complication of anesthesia    ? Connective tissue disease (HCC)    ? Infection      Surgical History:   Procedure Laterality Date   ? ANORECTAL MANOMETRY N/A 11/11/2019    Performed by Eliott Nine, MD at [facility]   ? LAPAROSCOPIC COLOSTOMY N/A 12/15/2019    Performed by Benetta Spar, MD at [facility] OR   ? ROBOT ASSISTED REPAIR PARASTOMAL HERNIA WITH MESH N/A 06/20/2021    Performed by Benetta Spar, MD at [facility] OR   ? ARTHROPLASTY     ? FOOT SURGERY     ? HERNIA REPAIR     ? HX APPENDECTOMY     ? HX CHOLECYSTECTOMY     ? HX HEMORRHOIDECTOMY     ? HX HYSTERECTOMY     ? HX JOINT REPLACEMENT     ? HYSTERECTOMY     ? KNEE SURGERY       Family History   Problem Relation Age of Onset   ? Osteoporosis Mother    ? Arthritis Mother    ? Heart Attack Father    ? Osteoporosis Sibling    ? Arthritis Sibling    ? Scoliosis Sibling      Social History     Socioeconomic History   ? Marital status: Married   Tobacco Use   ? Smoking status: Former     Types: Cigarettes     Quit date: 05/08/1977     Years since quitting: 44.2   ?  Smokeless tobacco: Never   Vaping Use   ? Vaping Use: Never used   Substance and Sexual Activity   ? Alcohol use: Yes     Comment: rarely   ? Drug use: Never   ? Sexual activity: Not Currently     Partners: Male     Vaping/E-liquid Use   ? Vaping Use Never User                      Review of Systems   Constitutional: Negative.  Negative for chills, fever and unexpected weight change.   HENT: Negative.    Eyes: Negative.    Respiratory: Negative.    Cardiovascular: Negative.    Gastrointestinal: Negative.  Negative for abdominal distention, abdominal pain, anal bleeding, blood in stool, constipation, diarrhea, nausea, rectal pain and vomiting.   Endocrine: Negative.    Genitourinary: Negative.    Musculoskeletal: Negative.    Skin: Negative.  Negative for color change and wound.   Allergic/Immunologic: Negative.    Neurological: Negative.  Negative for weakness and light-headedness.   Hematological: Negative.    Psychiatric/Behavioral: Negative.        .  Objective:         ? acetaminophen (TYLENOL EXTRA STRENGTH) 500 mg tablet Take two tablets by mouth every 6 hours as needed. Max of 4,000 mg of acetaminophen in 24 hours.   ? albuterol sulfate (PROAIR HFA) 90 mcg/actuation HFA aerosol inhaler Inhale two puffs by mouth into the lungs every 4 hours as needed. Shake well before use.   ? amitriptyline (ELAVIL) 25 mg tablet Take 1 tablet by mouth daily at bedtime with 50 mg tablet for a total of 75 mg daily at bedtime. (Patient taking differently: Take one tablet by mouth at bedtime as needed for Pain.)   ? amitriptyline (ELAVIL) 50 mg tablet Take one tablet by mouth at bedtime daily.   ? COLLAGEN MISC Take 1 capsule by mouth daily.   ? docusate sodium (COLACE PO) Take 200 mg by mouth twice daily.   ? estradioL (ESTRACE) 0.01 % (0.1 mg/g) vaginal cream Insert or Apply one g to vaginal area three times weekly.   ? famotidine (PEPCID) 20 mg tablet Take one tablet by mouth twice daily.   ? hydrocortisone (HYTONE) 2.5 % topical cream Apply  topically to affected area twice daily. (Patient taking differently: Apply  topically to affected area twice daily as needed.)   ? ibuprofen (ADVIL) 200 mg tablet Take one tablet to two tablets by mouth every 6 hours as needed. Take with food.   ? Lactobac no.41/Bifidobact no.7 (PROBIOTIC-10 PO) Take 1 capsule by mouth daily. 20 Billion    ? magnesium citrate oral solution Take 296 mL by mouth once.   ? methocarbamoL (ROBAXIN) 750 mg tablet Take one tablet by mouth twice daily.   ? nystatin (MYCOSTATIN) 100,000 unit/g topical cream Apply  topically to affected area twice daily as needed (Rash).   ? nystatin-triamcinolone 100,000 unit/g / 0.1 % topical cream Apply  topically to affected area four times daily.   ? ondansetron (ZOFRAN) 4 mg/2 mL soln Administer 2 mL through vein every 6 hours as needed.   ? ondansetron HCL (ZOFRAN) 4 mg tablet Take one tablet by mouth every 8 hours as needed for Nausea or Vomiting.   ? phenazopyridine (PYRIDIUM) 200 mg tablet Take one tablet by mouth three times daily as needed for Pain. Take after meals for up to 2 days.   ?  pilocarpine HCL (SALAGEN) 5 mg tablet Take one tablet by mouth four times daily.   ? polyethylene glycol 3350 (MIRALAX) 17 g packet Take one packet by mouth twice daily.   ? safflower oil/linoleic acid,co (CLA PO) Take 1,300 mg by mouth twice daily.   ? simethicone (GAS-X PO) Take 2 tablets by mouth at bedtime daily.   ? sodium chloride (MURO-5) 5 % ophthalmic solution Apply one drop to both eyes as Needed.   ? traMADoL (ULTRAM) 50 mg tablet Take one tablet by mouth every 6 hours as needed.          Telehealth Patient Reported Vitals     Row Name 08/03/21 0856                Weight: 73.9 kg (163 lb)        Pain Score: Five        Pain Location: ABDOMEN                  Telehealth Body Mass Index: 0 at 08/03/2021  8:57 AM    Physical Exam  Vitals reviewed.   Constitutional:       General: She is not in acute distress.     Appearance: Normal appearance. She is well-developed.   HENT:      Head: Normocephalic and atraumatic.      Nose: Nose normal.   Eyes:      General: Lids are normal.      Conjunctiva/sclera: Conjunctivae normal.   Pulmonary:      Effort: Pulmonary effort is normal. No respiratory distress.   Musculoskeletal:         General: Normal range of motion.      Cervical back: Normal range of motion.   Neurological:      Mental Status: She is alert and oriented to person, place, and time.   Psychiatric:         Speech: Speech normal.         Behavior: Behavior normal.         Thought Content: Thought content normal.         Judgment: Judgment normal.              Assessment and Plan:  61yo female s/p laparoscopic lysis of adhesions and robotic parastomal hernia repair with mesh (modified Sugarbaker) on 06/20/21. Pain improving. Still struggling with intermittent retraction of stoma.    Status Colostomy:  - Will attempt to order individual mastisol tubes for patient.   - If unable I recommend she come down to [place] to meet with the ostomy team. [place] may be another option.   - Recommend trying supplies through [facility] vs ordering and then not being able to return per [facility].   - Mucous will pass through rectum. If struggling could try a suppository or enema.    Plan:  - RTC as scheduled.     The patient and family were allowed to ask questions and voice concerns; these were addressed to the best of our ability. They expressed understanding of what was explained to them, and they agreed with the present plan. [facility] for the [facility] and was instructed on how to contact us with any questions or concerns.    My collaborating provider on this patient is Dr. Deedra Ehrich MD.    Brunilda Payor. Fredricka Bonine MSN, APRN, AGCNS-BC, AGPCNP-BC  Advanced Practice Provider  Colon and Rectal Surgery  Surgical Oncology  APP for Dr.  Ashcraft, Dr. Daphine Deutscher and Dr. Daiva Nakayama  Pager: [phone]  Available via Amie Critchley and [facility]                             15 minutes spent on this patient's encounter with counseling and coordination of care taking >50% of the visit.

## 2021-08-08 ENCOUNTER — Encounter: Admit: 2021-08-08 | Discharge: 2021-08-08 | Payer: BC Managed Care – PPO

## 2021-08-08 DIAGNOSIS — N3010 Interstitial cystitis (chronic) without hematuria: Secondary | ICD-10-CM

## 2021-08-08 MED ORDER — AMITRIPTYLINE 50 MG PO TAB
50 mg | ORAL_TABLET | Freq: Every evening | ORAL | 1 refills | Status: AC
Start: 2021-08-08 — End: ?

## 2021-08-08 NOTE — Telephone Encounter
Received refill request from [facility] pharmacy for amitriptyline 50 mg once daily.  Patient last seen by our office 05/25/2021.  Refill sent.

## 2021-08-16 ENCOUNTER — Encounter: Admit: 2021-08-16 | Discharge: 2021-08-16 | Payer: BC Managed Care – PPO

## 2021-09-06 ENCOUNTER — Encounter: Admit: 2021-09-06 | Discharge: 2021-09-06 | Payer: BC Managed Care – PPO

## 2021-09-06 MED ORDER — RXAMB MESALAMINE/BUDESONIDE 500MG/2MG RECTAL SUPPOSITORIES (COMPOUND)
1 | Freq: Every day | RECTAL | 0 refills | 30.00000 days | Status: AC
Start: 2021-09-06 — End: ?
  Filled 2021-09-06: qty 15, 30d supply, fill #1

## 2021-09-10 ENCOUNTER — Encounter: Admit: 2021-09-10 | Discharge: 2021-09-10 | Payer: BC Managed Care – PPO

## 2021-09-28 ENCOUNTER — Encounter: Admit: 2021-09-28 | Discharge: 2021-09-28 | Payer: BC Managed Care – PPO

## 2021-10-10 ENCOUNTER — Encounter: Admit: 2021-10-10 | Discharge: 2021-10-10 | Payer: BC Managed Care – PPO

## 2021-11-15 ENCOUNTER — Encounter: Admit: 2021-11-15 | Discharge: 2021-11-15 | Payer: BC Managed Care – PPO

## 2021-11-15 DIAGNOSIS — T8859XA Other complications of anesthesia, initial encounter: Secondary | ICD-10-CM

## 2021-11-15 DIAGNOSIS — K6289 Other specified diseases of anus and rectum: Secondary | ICD-10-CM

## 2021-11-15 DIAGNOSIS — M1990 Unspecified osteoarthritis, unspecified site: Secondary | ICD-10-CM

## 2021-11-15 DIAGNOSIS — B999 Unspecified infectious disease: Secondary | ICD-10-CM

## 2021-11-15 DIAGNOSIS — M359 Systemic involvement of connective tissue, unspecified: Secondary | ICD-10-CM

## 2021-11-15 MED ORDER — CELECOXIB 100 MG PO CAP
200 mg | Freq: Once | ORAL | 0 refills
Start: 2021-11-15 — End: ?

## 2021-11-15 MED ORDER — GABAPENTIN 300 MG PO CAP
600 mg | Freq: Once | ORAL | 0 refills
Start: 2021-11-15 — End: ?

## 2021-11-15 MED ORDER — SODIUM CHLORIDE 0.9 % IV SOLP
250 mL | INTRAVENOUS | 0 refills
Start: 2021-11-15 — End: ?

## 2021-11-15 MED ORDER — METRONIDAZOLE IN NACL (ISO-OS) 500 MG/100 ML IV PGBK
500 mg | Freq: Once | INTRAVENOUS | 0 refills
Start: 2021-11-15 — End: ?

## 2021-11-15 MED ORDER — ACETAMINOPHEN 500 MG PO TAB
1000 mg | Freq: Once | ORAL | 0 refills
Start: 2021-11-15 — End: ?

## 2021-11-15 MED ORDER — NALOXEGOL 25 MG PO TAB
25 mg | Freq: Once | ORAL | 0 refills
Start: 2021-11-15 — End: ?

## 2021-11-15 MED ORDER — PRESURGERY KIT B
PACK | 0 refills | Status: AC
Start: 2021-11-15 — End: ?
  Filled 2021-11-17: qty 1, 1d supply, fill #1

## 2021-11-15 MED ORDER — CEFAZOLIN INJ 1GM IVP
2 g | Freq: Once | INTRAVENOUS | 0 refills
Start: 2021-11-15 — End: ?

## 2021-11-15 NOTE — Patient Instructions
[facility]  Surgery Instructions    Surgery Date: Monday, August 7th, 2023  (Please be aware that this appointment for your surgery will not be visible in the MyChart portal.)    Location:  Your surgery will be held at [facility] ([address] [place], [place] [address].)  Where to park on the day of surgery: [employer] (located just [place] [address] on [place])  Where to check in on the day of surgery: In the Admitting office (located on Level 1 of hospital of the [facility])      Arrival Time:   The OR scheduling office must finalize the OR schedule before we can provide a time for your surgery. Please do not call your physicians office about surgery arrival time. Your surgery start time and arrive times will be called out to you the day before your procedure between 2pm-4:30pm. If you have not heard from the hospital regarding your surgery time by 4:30PM the evening before your surgery, please call [phone].  The day of the procedure, if you cannot keep your appointment, or are delayed, please contact the surgery center immediately at [phone].      Illness:  If you develop symptoms of illness within 14 days of your procedure, please call your provider's office (nurse contact can be found at the bottom of this education).  Symptoms of concerns can included: Fever or chills, Cough, Shortness of breath or difficulty breathing, Fatigue, Muscle or body aches, Headache, New loss of taste or smell, Sore throat, Congestion or runny nose, Nausea or vomiting, Diarrhea.    ERAS (Enhanced Recovery After Surgery):   You have been enrolled in a special program called Enhanced Recover After Surgery. This Program uses interventions that have been shown to reduce complications like blood clots, pneumonia and infection. It also helps to get you home sooner. Key points are to do the presurgical exercises and walking program (which you will ready about below), drink your nutrition shakes before and after surgery as instructed, get up and walk as much as possible, before after surgery, and follow your hospital nurse's instructions about bathing and gum chewing after surgery.    Presurgical Kits:   You will be ordered a presurgical kit. This Presurgical Kit will include products designed to prepare you for surgery and assist in your recovery.   If pre-operative antibiotics are indicated for your surgery, these will be included within the kit. There is a small fee for antibiotics included in this kit. Your insurance should help cover the cost of these antibiotics, but whatever is not covered by your insurance, will be your responsibility to pay. Payment method for these antibiotics must be on file before the kit will be shipped to your home. If you receive a call from the [employer], it is likely a they need to enter a payment method in your account before they can ship this kit.)  ** If you have not received this kit and it is 1.5 weeks prior to the date of your procedure, please call the [facility] at phone number [phone] to inquire about the status of this kit.    Eating and drinking:   You must be on a clear liquid beginning the day before to surgery. Beginning at 11pm the night prior to surgery, please limit your fluid intake to only sips or small amounts (1-2oz) of water and clear  liquids. ALL CLEAR LIQUIDS AND WATER must be stopped 2 hours before you need to arrive to the hospital the day of your surgery. (Be aware, you can take a small sip of water with medications the morning of surgery, if necessary.)    Bowel Prep:   Full bowel prep (A pre-surgical kit will be delivered directly to your home address. There will be instructions along with products you will need to prepare for surgery.)    Hygiene:    Shower the night before and the morning of your procedure with Hibiclens (wipes and the instructions for use will be delivered in your pre-surgical kit).    PreSurgical Dietary Consultation:   You may receive a call from a dietician prior to surgery. They may schedule a telephone call with you to discuss pre-surgical nutrition. Be aware, not every patient will receive a call.     Before Surgery Walking Program:  Your healthcare team would like you to participate in a walking program prior to your upcoming abdominal surgery.   This will help to increase your endurance and strength before undergoing surgery.   It is recommended that you make time to walk 5 days out of every week.   Where to walk: You can start with your neighborhood, a park, track, mall, living room, or gym.  If you have peripheral neuropathy or are unable to walk, a stationary bike may be more appropropriate. Patients undergoing chemotherapy or radiation may have lower tolerance to exercise. A walking program is still recommended however you may want to lower frequency and intensity of the program     Beginner Program  Week 1: Begin with a 15-minute walk   5 minute warm up, 5 minute brisk walk, 5 minute cool down   Walk at a light to moderate intensity. You should be able to talk when walking  Add an additional 5 minutes or more every week as able to tolerate   Week 2: 20 minutes   5 minute warm up, 10 minute walk, 5 minute cool down   Week 3: 25 minutes   5 minute warm up, 10 minute walk, 5 minute cool down  Week 4:30 minutes    5 minute warm up, 15 minute walk, 5 minute cool down   You may break you walks up throughout the day if necessary.   Remember to always rest as necessary and you should discontinue walking if you experience increased pain or dizziness.    Advanced Program  Week 1: Begin with a 30-minute walk   5 minute warm up, 20 minute walk, 5 minute cool down   Walk at a brisk or vigorous speed if able  Add an additional 10 or more minutes every week as able to tolerate   Week 2: 40 minute walk   5 minute warm up, 30 minute walk, 5 minute cool down  Week 3: 50 minute walk   5 minute warm up, 40 minute walk, 5 minute cool down  Week 4: 60 minute walk    5 minute warm up, 40 minute walk, 5 minute cool down  You may break you walks up multiple times throughout the day if needed.   Remember to always rest as necessary you should discontinue walking if you experience increased pain or dizziness.  Target is 14-17 RPE (rate of perceived exertion)        Abdominal Surgery Prehab Exercises: Lower Extremity Strengthening  Complete each exercise 5 times, then increase your repetitions as tolerated.  SIT TO STAND     Start seated in a chair.  Without using your arms, slowly rise to full standing position.     You can use your arms if you are unable to complete this exercise without them.  Slowly return to sitting.  SIDE KICK     Hold onto table, counter or sturdy chair for balance.  Keep knee straight, toes pointed forward, move one leg outward.  Return to start position.  Repeat with other leg.  BRIDGING     Lie on your back with your knees bent and feet on the bed.  Squeeze your buttocks muscles and slowly lift your hips off the bed to create a ?bridge? with your body.  Hold for 3 seconds and then lower yourself back to bed.    Medications:    You will be given medication instructions by the Pre-Operative Assessment Clinic [facility]). If you have NOT been scheduled a Pre-Operative Assessment appointment, you will receive a phone call to be ?triaged? by the anesthesia team. Please be sure to speak with the anesthesia team or attend your [facility] appointment if setup for you. If you do not present to your scheduled appointment or complete the triage phone call, this may result in cancellation or delay of your surgery.    Please contact the pre-anesthesia pharmacist by phone number [phone] , or by e-mail ([email or web address]) for the following reasons:   If you did not receive medication instructions and it is less than 2 weeks prior to your surgery date  You have questions about any of your current medications instructions previously provided by the [facility] team  You have any changes to your medication list after you attended your appointment or [facility] triage phone call    Personal Items:    Do not wear makeup, fingernail polish, lotion, deodorant, or perfume  Remove all metal jewelry and piercings.   Bring a Retail buyer for your glasses, contacts, hearing aids, dentures, etc.     Thing to Bring:    Personal identification (ID) / Surveyor, quantity card(s)  Official documents for legal guardianship  Copy of your Living Will, Advanced Directives, and/or Durable Power of [employer]. If you have these documents, please bring them to the admissions office on the day of your surgery to be scanned into your records.  CPAP/BiPAP machine (including all supplies)  Walker, cane, or motorized scooter  Cases for glasses/hearing aids/contact lens (bring solutions for contacts)  Stimulator remote  Do not bring medications from home unless instructed by a pharmacist.    Transportation Needs:    Please be aware of the following:   You are to be admitted into the hospital following surgery. A visitor will be allowed to stay the night with you once you have been admitted into a hospital room. When you are ready to be discharged, if no visitor is present, they will call your friend/family to notify them when to pick you up from the hospital.     FMLA:   If you have any FMLA or Short Term Disability Paperwork needs, you may have these faxed to [phone]- Attention to Einar Pheasant, RN. Be sure your name and birthday are on the forms, along with a return fax number for this office to return them to once completed. Please allow up to 7-10 business days for completion of paperwork.     Billing Questions:   A member of the business office will verify your medical benefits with your insurance company  prior to your surgery. If you have questions about understanding the cost of your care please call the Financial Customer Service at [phone].     Current Visitor Policies:  A maximum of 2 total visitors are allowed during your hospital admission each day.   During the time of your surgery, due to the size of the perioperative waiting room, only 1 visitor is allowed in the designated waiting room.  If you have 2 visitors during the time of your surgery, please know your visitors will be asked to find a place to wait in the hospital lobby or cafeteria and will be notified by the nurse liaison team, when you are out of surgery.  No visitors are allowed in pre/post patient care areas until a patient is transferred to a patient room.    Any visitors who have a fever or other cold- or flu-like symptoms will not be allowed in [facility] [place] [facility] facilities.   We encourage all visitors to wash their hands or use hand sanitizer when entering or exiting patient rooms. Mask wearing, although not required at this time, is still highly encouraged.   Children younger than age 68 are allowed to visit inpatients.   Additional guidelines may vary, based on patient care area or patient's condition.    Thank you for understanding our visitation policies. We will continue to evaluate our recommendations and keep you updated on revisions based on public health guidance.        If you have questions, please call [phone]. Ask to be connected to Einar Pheasant (Nurse for Dr. Daphine Deutscher).    If your needs are URGENT and it?s after hours   please call [phone] and have the on call provider with Surgical Oncology paged.      PLEASE BE AWARE, EVERYTHING AFTER THIS IS EDUCATION FOR AFTER SURGERY CARE    Back Safety: Getting Into and Out of Bed  Safety Tip: After you stand up, wait a moment before walking to be sure you?re not dizzy.   Good posture protects your back when you sit, stand, and walk. It is also important while getting into and out of bed. Follow the steps below to get out of bed. Reverse them to get into bed.     Roll Onto Your Side   1. Roll Onto Your Side  Bend your knees up and keep them together.  Reach your opposite arm across your body towards the direction of the roll.  Roll your shoulders and hips at the same time, like a log, to avoid twisting.       Raise Your Body   2. Raise Your Body  Place hands on the bed in front of you.  Bring your legs off the edge of the bed, as you push your upper body up with your arms.  Allow the weight of your legs to help you move.       Stand Up   3. Stand Up  Lean forward from your hip and roll onto the balls of your feet.  Flatten your stomach muscles to keep your back from arching.  Using your arm and leg muscles, push yourself to a standing position.   and     AFTER SURGERY EDUCATION FROM DR. MARTIN    You will be undergoing an abdominal surgery. Here are a few things to expect after your surgery:    1) Pain is to be expected after surgery. You should have been given a prescription for  pain medication. Use this to relieve your pain as needed. It is important that your pain is well controlled enough that you can move around as this will aid your recovery. Generally, pain medication works better if you do not wait until you are in severe pain before using it, as it takes 30-60 minutes for the medications to take effect. Please also remember that we cannot refill narcotic medications on nights or weekends, please try to allow for 3 day advanced request to refill pain medication, as it can take some time for the office to confirm refill authorization.       2) We want you to use over the counter pain medication to help control your pain as well. Please be aware that in these first few weeks, your pain will likely not be a 0/10, but to find an acceptable pain tolerance level and to make this your goal for pain control. Our goal is to wean you away from the use of prescription pain medication as soon as possible after surgery. We would like to get you to the point that the only thing you need for pain management after surgery is over the counter Tylenol for mild discomforts.    For pain after abdominal surgery:    Be sure to take 1000mg  dose of Tylenol (acetaminophen) every six hours. (Remember to set an alarm or timer to go off every 6 hours to remind you to take your next dose).  If you have ever been informed by another provider not to take Tylenol, please notify your surgeon's office/nurse to discuss alternative options.    You can take your prescription pain medication (Tramadol/Oxycodone/Hydrocodone) at the same time as your Tylenol. (Please set a timer for your next prescribed interval (4 hours, 6 hours, 8 hours, 12 hours), to re-assess your pain. If you feel you need to take another dose of (Tramadol/Oxycodone/Hydrocodone) you can. HOWEVER, if your pain is well controlled with the over the counter Tylenol, do NOT take.   It can be hard to wake yourself up at night, but if any of these alarms/timers fall in the middle of the night, we recommend waking to assess your pain during the overnight hours. Pain can get away from you quickly while you sleep, and we do not want you to wake up with uncontrolled pain in the mornings. It is better to re-dose your medication in the middle of the night, if needed, rather than waking up and requiring higher doses due to increased pain.    Use a heating pad to the abdomen/pelvis for 20-30 minutes at a time to help with discomfort.    Prescription pain medications can be constipating. Constipation can cause additional pain or recovery complications. We recommend, if you are not already doing so, to take a daily bowel regimen to help prevent constipation (see below).    Recommended Post Operative Bowel Regimen:  Docusate Sodium (50mg  tablets) - take 1 tablet twice daily  Miralax (1 dose) daily  **If you should go more than 2-3 days without a bowel movement, take a dose of Miralax every 2-3 hours until a bowel movement is initiated.   **If you should experience diarrhea, please stop taking Miralax. Only use when needed.    3) Clear yellow or fruit-punch colored fluid can build up under your incision. Sometimes this oozes out, or sometimes a large amount will come from the wound in a short period of time, and then slows down - this can be normal. If the  fluid is thick and foul-smelling, you have redness spreading out from your wound, fever greater than 101.5 F, or worsening pain you should call your provider.    4) It is fine to shower after your surgery unless your provider has instructed you otherwise. Do not scrub the wounds. Do not apply an antibacterial ointment to the wound unless instructed otherwise. Baths or hot tubs should not be used until it is cleared by your provider at the first postoperative visit. If a portion of your wound is open, the dressing should be removed before showering. It is OK to get soap and water in the wound. After showering, pat dry and reapply the dressing.    5) Your diet after discharge should largely consist of low residue foods for at least 4-6 weeks after surgery.  No raw vegetables, fruit skins, or fibrous foods that require a lot of chewing (nuts, seeds, corns, popcorn).  We recommend eating slowly, chewing thoroughly, and eating small frequent meals throughout the day.  Protein supplements such as Boost or Ensure may also be beneficial.  Make sure you stay well hydrated.    6) Nausea can occur, and it is common to have less appetite or get full more quickly for several weeks after surgery. Frequent small meals work best. Try taking your pain medication with food to minimize any nausea from your pain medication. If you have vomiting or the nausea that is hard to control, call your provider.    7) It is normal for bowel movements to be irregular after surgery. However, at times diarrhea or constipation can be of concern. You may want to call you provider if you are having more than three diarrheal bowel movements for two days in a row, or have not had a bowel movement for three days.    8) It is normal for your temperature to be slightly higher than normal after surgery, or to feel cold or have hot flashes. If you have a multiple fevers over 101.5, please call your provider.    9) You should not lift more than 10 pounds (about a gallon of milk) for 6-8 weeks after surgery.  I would recommend you also not doing any strenuous/physical activities at least until your 1st postop appointment.  You should not drive while on narcotics pain medication.    10) You should resume all of your pre-surgery home medications as previously prescribed.  If any changes have been made to your medication regimen or to the doses of your medications, this will be noted on your hospital discharge paperwork.  If you have any questions about your pre-surgery medications or their doses, please contact your primary care provider.    11)  If you have an ileostomy, you should measure that output every day for the first 2 weeks after surgery until you are more familiar with it.  Record the output on a piece of paper and bring that with you to your first post-op appointment.  If you have ileostomy output >1000 mL for 2 days in a row, please call the office.  If you start to get symptoms of dehydration (nausea, vomiting, dizziness, lightheadedness, headache) you should call the office.  **Ostomy Supplies: if you are going home with a new ileostomy or colostomy, your home health nurse will help you arrange for ostomy supplies. If you do not have home health, or they do not assist you with arranging ostomy supply order prior to your discharge from home health, you may call [address]  at [phone]. They will send you a startup kit and set you up with an ostomy supply distributer.  **Ostomy Issues: If you are having issues with your ostomy/stoma, please call Penni Homans, RN at [phone] and she will help arrange an ostomy clinic appointment for you, if needed.    12) You will need to be seen by Dr. Daphine Deutscher or Deberah Pelton, NP, in 2-3 weeks after surgery. Please contact our clinic scheduler at [phone] if you do not have a follow-up appointment or have not heard from our clinic in 3 business day after discharge to schedule a follow-up appointment.  If you have any questions or concerns - PLEASE CALL [phone] during normal business hours (M-F 8:00AM-4PM), it is better to wait until the daytime as someone more familiar with your care will be able to help you.  However, if it is an emergency, the on-call surgeon will be able to give you good advice. You can reach this by calling the main hospital at [phone] and requesting someone from surgical oncology be paged.

## 2021-11-16 ENCOUNTER — Inpatient Hospital Stay: Admit: 2021-11-16 | Discharge: 2021-11-16 | Payer: BC Managed Care – PPO

## 2021-11-16 ENCOUNTER — Encounter: Admit: 2021-11-16 | Discharge: 2021-11-16 | Payer: BC Managed Care – PPO

## 2021-11-16 DIAGNOSIS — K6289 Other specified diseases of anus and rectum: Secondary | ICD-10-CM

## 2021-11-17 ENCOUNTER — Encounter: Admit: 2021-11-17 | Discharge: 2021-11-17 | Payer: BC Managed Care – PPO

## 2021-11-23 ENCOUNTER — Encounter: Admit: 2021-11-23 | Discharge: 2021-11-23 | Payer: BC Managed Care – PPO

## 2021-12-10 ENCOUNTER — Encounter: Admit: 2021-12-10 | Discharge: 2021-12-10 | Payer: BC Managed Care – PPO

## 2021-12-12 ENCOUNTER — Encounter: Admit: 2021-12-12 | Discharge: 2021-12-12 | Payer: BC Managed Care – PPO

## 2021-12-12 ENCOUNTER — Inpatient Hospital Stay: Admit: 2021-12-12 | Discharge: 2021-12-12 | Payer: BC Managed Care – PPO

## 2021-12-12 DIAGNOSIS — M1990 Unspecified osteoarthritis, unspecified site: Secondary | ICD-10-CM

## 2021-12-12 DIAGNOSIS — M359 Systemic involvement of connective tissue, unspecified: Secondary | ICD-10-CM

## 2021-12-12 DIAGNOSIS — T8859XA Other complications of anesthesia, initial encounter: Secondary | ICD-10-CM

## 2021-12-12 DIAGNOSIS — B999 Unspecified infectious disease: Secondary | ICD-10-CM

## 2021-12-12 MED ORDER — ROCURONIUM 10 MG/ML IV SOLN
INTRAVENOUS | 0 refills | Status: DC
Start: 2021-12-12 — End: 2021-12-12
  Administered 2021-12-12: 13:00:00 60 mg via INTRAVENOUS
  Administered 2021-12-12: 14:00:00 20 mg via INTRAVENOUS
  Administered 2021-12-12: 16:00:00 10 mg via INTRAVENOUS
  Administered 2021-12-12: 15:00:00 30 mg via INTRAVENOUS
  Administered 2021-12-12: 14:00:00 20 mg via INTRAVENOUS

## 2021-12-12 MED ORDER — PROPOFOL INJ 10 MG/ML IV VIAL
INTRAVENOUS | 0 refills | Status: DC
Start: 2021-12-12 — End: 2021-12-12
  Administered 2021-12-12: 13:00:00 140 mg via INTRAVENOUS

## 2021-12-12 MED ORDER — KETAMINE 10 MG/ML IJ SOLN
INTRAVENOUS | 0 refills | Status: DC
Start: 2021-12-12 — End: 2021-12-12
  Administered 2021-12-12: 13:00:00 20 mg via INTRAVENOUS

## 2021-12-12 MED ORDER — FENTANYL CITRATE (PF) 50 MCG/ML IJ SOLN
INTRAVENOUS | 0 refills | Status: DC
Start: 2021-12-12 — End: 2021-12-12
  Administered 2021-12-12: 17:00:00 25 ug via INTRAVENOUS
  Administered 2021-12-12 (×2): 50 ug via INTRAVENOUS
  Administered 2021-12-12: 13:00:00 100 ug via INTRAVENOUS

## 2021-12-12 MED ORDER — EPHEDRINE SULFATE 50 MG/5ML SYR (10 MG/ML) (AN)(OSM)
INTRAVENOUS | 0 refills | Status: DC
Start: 2021-12-12 — End: 2021-12-12
  Administered 2021-12-12 (×2): 10 mg via INTRAVENOUS

## 2021-12-12 MED ORDER — MIDAZOLAM 1 MG/ML IJ SOLN
INTRAVENOUS | 0 refills | Status: DC
Start: 2021-12-12 — End: 2021-12-12
  Administered 2021-12-12: 13:00:00 2 mg via INTRAVENOUS

## 2021-12-12 MED ORDER — DEXAMETHASONE SODIUM PHOSPHATE 4 MG/ML IJ SOLN
INTRAVENOUS | 0 refills | Status: DC
Start: 2021-12-12 — End: 2021-12-12
  Administered 2021-12-12: 13:00:00 4 mg via INTRAVENOUS

## 2021-12-12 MED ORDER — ARTIFICIAL TEARS (PF) SINGLE DOSE DROPS GROUP
OPHTHALMIC | 0 refills | Status: DC
Start: 2021-12-12 — End: 2021-12-12
  Administered 2021-12-12: 13:00:00 2 [drp] via OPHTHALMIC

## 2021-12-12 MED ORDER — SUGAMMADEX 100 MG/ML IV SOLN
INTRAVENOUS | 0 refills | Status: DC
Start: 2021-12-12 — End: 2021-12-12
  Administered 2021-12-12: 17:00:00 150 mg via INTRAVENOUS

## 2021-12-12 MED ORDER — HALOPERIDOL LACTATE 5 MG/ML IJ SOLN
INTRAVENOUS | 0 refills | Status: DC
Start: 2021-12-12 — End: 2021-12-12
  Administered 2021-12-12: 17:00:00 1 mg via INTRAVENOUS

## 2021-12-12 MED ORDER — ALBUMIN, HUMAN 5 % 250 ML IV SOLP (AN)(OSM)
INTRAVENOUS | 0 refills | Status: DC
Start: 2021-12-12 — End: 2021-12-12
  Administered 2021-12-12: 14:00:00 via INTRAVENOUS

## 2021-12-12 MED ORDER — LIDOCAINE (PF) 200 MG/10 ML (2 %) IJ SYRG
INTRAVENOUS | 0 refills | Status: DC
Start: 2021-12-12 — End: 2021-12-12
  Administered 2021-12-12: 13:00:00 60 mg via INTRAVENOUS

## 2021-12-12 MED ORDER — ONDANSETRON HCL (PF) 4 MG/2 ML IJ SOLN
INTRAVENOUS | 0 refills | Status: DC
Start: 2021-12-12 — End: 2021-12-12
  Administered 2021-12-12: 16:00:00 4 mg via INTRAVENOUS

## 2021-12-12 MED ORDER — ELECTROLYTE-A IV SOLP
INTRAVENOUS | 0 refills | Status: DC
Start: 2021-12-12 — End: 2021-12-12
  Administered 2021-12-12: 13:00:00 via INTRAVENOUS

## 2021-12-12 MED ORDER — LACTATED RINGERS IV SOLP
INTRAVENOUS | 0 refills | Status: DC
Start: 2021-12-12 — End: 2021-12-12
  Administered 2021-12-12: 14:00:00 via INTRAVENOUS

## 2021-12-12 MED ADMIN — LACTATED RINGERS IV SOLP [4318]: 1000.000 mL | INTRAVENOUS | @ 22:00:00 | Stop: 2021-12-13 | NDC 00338011704

## 2021-12-12 MED ADMIN — WATER FOR INJECTION, STERILE IJ SOLN [79513]: 20 mL | @ 23:00:00 | Stop: 2021-12-12 | NDC 00409488723

## 2021-12-12 MED ADMIN — GABAPENTIN 300 MG PO CAP [18308]: 600 mg | ORAL | @ 12:00:00 | Stop: 2021-12-12 | NDC 00904666661

## 2021-12-12 MED ADMIN — ACETAMINOPHEN 500 MG PO TAB [102]: 1000 mg | ORAL | @ 23:00:00 | Stop: 2021-12-15 | NDC 00904673061

## 2021-12-12 MED ADMIN — SCOPOLAMINE BASE 1 MG OVER 3 DAYS TD PT3D [82141]: 1 | TRANSDERMAL | @ 13:00:00 | NDC 10019055390

## 2021-12-12 MED ADMIN — OXYCODONE 5 MG PO TAB [10814]: 5 mg | ORAL | @ 17:00:00 | Stop: 2021-12-12 | NDC 00406055223

## 2021-12-12 MED ADMIN — BUPIVACAINE (PF) 0.25 % (2.5 MG/ML) IJ SOLN [87866]: 10 mL | INTRAMUSCULAR | @ 16:00:00 | Stop: 2021-12-12 | NDC 00409155918

## 2021-12-12 MED ADMIN — NALOXEGOL 12.5 MG PO TAB [324272]: 25 mg | ORAL | @ 12:00:00 | Stop: 2021-12-12 | NDC 57841130001

## 2021-12-12 MED ADMIN — CEFAZOLIN INJ 1GM IVP [210319]: 2 g | INTRAVENOUS | @ 23:00:00 | Stop: 2021-12-13 | NDC 60505614200

## 2021-12-12 MED ADMIN — LIDOCAINE-EPINEPHRINE 1 %-1:100,000 IJ SOLN [15955]: 10 mL | INTRAMUSCULAR | @ 16:00:00 | Stop: 2021-12-12 | NDC 00409317817

## 2021-12-12 MED ADMIN — CELECOXIB 200 MG PO CAP [76958]: 200 mg | ORAL | @ 12:00:00 | Stop: 2021-12-12 | NDC 00904650361

## 2021-12-12 MED ADMIN — SODIUM CHLORIDE 0.9 % IV SOLP [27838]: 250.000 mL | INTRAVENOUS | @ 13:00:00 | Stop: 2021-12-14 | NDC 00338004902

## 2021-12-12 MED ADMIN — GABAPENTIN 300 MG PO CAP [18308]: 300 mg | ORAL | @ 23:00:00 | Stop: 2021-12-15 | NDC 00904666661

## 2021-12-12 MED ADMIN — FENTANYL CITRATE (PF) 50 MCG/ML IJ SOLN [3037]: 50 ug | INTRAVENOUS | @ 17:00:00 | Stop: 2021-12-12 | NDC 00409909412

## 2021-12-12 MED ADMIN — BUPIVACAINE (PF) 0.25 % (2.5 MG/ML) IJ SOLN [87866]: 8 mL | INTRAMUSCULAR | @ 15:00:00 | Stop: 2021-12-12 | NDC 00409155918

## 2021-12-12 MED ADMIN — METRONIDAZOLE IN NACL (ISO-OS) 500 MG/100 ML IV PGBK [5018]: 500 mg | INTRAVENOUS | @ 23:00:00 | Stop: 2021-12-13 | NDC 00338105548

## 2021-12-12 MED ADMIN — LACTATED RINGERS IV SOLP [4318]: INTRAVENOUS | @ 17:00:00 | Stop: 2021-12-12 | NDC 00338011704

## 2021-12-12 MED ADMIN — ACETAMINOPHEN 500 MG PO TAB [102]: 1000 mg | ORAL | @ 12:00:00 | Stop: 2021-12-12 | NDC 00904673061

## 2021-12-12 MED ADMIN — CEFAZOLIN INJ 1GM IVP [210319]: 2 g | INTRAVENOUS | @ 13:00:00 | Stop: 2021-12-12 | NDC 00143992490

## 2021-12-12 MED ADMIN — LIDOCAINE-EPINEPHRINE 1 %-1:100,000 IJ SOLN [15955]: 8 mL | INTRAMUSCULAR | @ 15:00:00 | Stop: 2021-12-12 | NDC 00409317817

## 2021-12-12 MED ADMIN — METRONIDAZOLE IN NACL (ISO-OS) 500 MG/100 ML IV PGBK [5018]: 500 mg | INTRAVENOUS | @ 12:00:00 | Stop: 2021-12-12 | NDC 00409015201

## 2021-12-13 ENCOUNTER — Encounter: Admit: 2021-12-13 | Discharge: 2021-12-13 | Payer: BC Managed Care – PPO

## 2021-12-13 MED ADMIN — METRONIDAZOLE IN NACL (ISO-OS) 500 MG/100 ML IV PGBK [5018]: 500 mg | INTRAVENOUS | @ 06:00:00 | Stop: 2021-12-13 | NDC 00338105548

## 2021-12-13 MED ADMIN — METHOCARBAMOL 750 MG PO TAB [4972]: 750 mg | ORAL | @ 15:00:00 | NDC 70010077005

## 2021-12-13 MED ADMIN — ACETAMINOPHEN 500 MG PO TAB [102]: 1000 mg | ORAL | Stop: 2021-12-15 | NDC 00904673061

## 2021-12-13 MED ADMIN — LACTATED RINGERS IV SOLP [4318]: 1000.000 mL | INTRAVENOUS | @ 09:00:00 | Stop: 2021-12-13 | NDC 00338011704

## 2021-12-13 MED ADMIN — NALOXEGOL 12.5 MG PO TAB [324272]: 25 mg | ORAL | @ 11:00:00 | NDC 57841130001

## 2021-12-13 MED ADMIN — CEFAZOLIN INJ 1GM IVP [210319]: 2 g | INTRAVENOUS | @ 14:00:00 | Stop: 2021-12-13 | NDC 60505614200

## 2021-12-13 MED ADMIN — DOCUSATE SODIUM 100 MG PO CAP [2566]: 200 mg | ORAL | @ 14:00:00 | NDC 00904718361

## 2021-12-13 MED ADMIN — TRAMADOL 50 MG PO TAB [14632]: 50 mg | ORAL | @ 02:00:00 | NDC 00904717961

## 2021-12-13 MED ADMIN — PILOCARPINE HCL 5 MG PO TAB [12803]: 5 mg | ORAL | @ 15:00:00 | NDC 68084092895

## 2021-12-13 MED ADMIN — POTASSIUM CHLORIDE 20 MEQ PO TBTQ [35943]: 40 meq | ORAL | @ 20:00:00 | Stop: 2021-12-13 | NDC 00832532511

## 2021-12-13 MED ADMIN — CELECOXIB 100 MG PO CAP [82263]: 200 mg | ORAL | @ 02:00:00 | Stop: 2021-12-16 | NDC 00904650261

## 2021-12-13 MED ADMIN — CELECOXIB 100 MG PO CAP [82263]: 200 mg | ORAL | @ 14:00:00 | Stop: 2021-12-16 | NDC 00904650261

## 2021-12-13 MED ADMIN — GABAPENTIN 300 MG PO CAP [18308]: 300 mg | ORAL | @ 14:00:00 | Stop: 2021-12-15 | NDC 00904666661

## 2021-12-13 MED ADMIN — WATER FOR INJECTION, STERILE IJ SOLN [79513]: 20 mL | @ 06:00:00 | Stop: 2021-12-13 | NDC 00409488723

## 2021-12-13 MED ADMIN — OXYCODONE 5 MG PO TAB [10814]: 5 mg | ORAL | @ 15:00:00 | NDC 00406055223

## 2021-12-13 MED ADMIN — OXYCODONE 5 MG PO TAB [10814]: 5 mg | ORAL | NDC 00406055223

## 2021-12-13 MED ADMIN — GABAPENTIN 300 MG PO CAP [18308]: 300 mg | ORAL | @ 02:00:00 | Stop: 2021-12-15 | NDC 00904666661

## 2021-12-13 MED ADMIN — ACETAMINOPHEN 500 MG PO TAB [102]: 1000 mg | ORAL | @ 06:00:00 | Stop: 2021-12-15 | NDC 00904673061

## 2021-12-13 MED ADMIN — CEFAZOLIN INJ 1GM IVP [210319]: 2 g | INTRAVENOUS | @ 06:00:00 | Stop: 2021-12-13 | NDC 60505614200

## 2021-12-13 MED ADMIN — PILOCARPINE HCL 5 MG PO TAB [12803]: 5 mg | ORAL | @ 20:00:00 | NDC 68084092895

## 2021-12-13 MED ADMIN — PILOCARPINE HCL 5 MG PO TAB [12803]: 5 mg | ORAL | NDC 68084092895

## 2021-12-13 MED ADMIN — WATER FOR INJECTION, STERILE IJ SOLN [79513]: 20 mL | @ 14:00:00 | Stop: 2021-12-13 | NDC 00409488723

## 2021-12-13 MED ADMIN — ACETAMINOPHEN 500 MG PO TAB [102]: 1000 mg | ORAL | @ 14:00:00 | Stop: 2021-12-15 | NDC 00904673061

## 2021-12-13 MED ADMIN — GABAPENTIN 300 MG PO CAP [18308]: 300 mg | ORAL | @ 20:00:00 | Stop: 2021-12-15 | NDC 00904666661

## 2021-12-13 MED ADMIN — MAGNESIUM SULFATE IN WATER 4 GRAM/50 ML (8 %) IV PGBK [166563]: 4 g | INTRAVENOUS | @ 20:00:00 | Stop: 2021-12-13 | NDC 00409673050

## 2021-12-13 MED ADMIN — POLYETHYLENE GLYCOL 3350 17 GRAM PO PWPK [25424]: 17 g | ORAL | @ 14:00:00 | NDC 00904693186

## 2021-12-14 ENCOUNTER — Encounter: Admit: 2021-12-14 | Discharge: 2021-12-14 | Payer: BC Managed Care – PPO

## 2021-12-14 DIAGNOSIS — T8859XA Other complications of anesthesia, initial encounter: Secondary | ICD-10-CM

## 2021-12-14 DIAGNOSIS — M1990 Unspecified osteoarthritis, unspecified site: Secondary | ICD-10-CM

## 2021-12-14 DIAGNOSIS — M359 Systemic involvement of connective tissue, unspecified: Secondary | ICD-10-CM

## 2021-12-14 DIAGNOSIS — B999 Unspecified infectious disease: Secondary | ICD-10-CM

## 2021-12-14 MED ADMIN — OXYCODONE 5 MG PO TAB [10814]: 5 mg | ORAL | @ 17:00:00 | NDC 00406055223

## 2021-12-14 MED ADMIN — CELECOXIB 100 MG PO CAP [82263]: 200 mg | ORAL | @ 14:00:00 | Stop: 2021-12-16 | NDC 00904650261

## 2021-12-14 MED ADMIN — PILOCARPINE HCL 5 MG PO TAB [12803]: 5 mg | ORAL | @ 14:00:00 | NDC 68084092895

## 2021-12-14 MED ADMIN — CELECOXIB 100 MG PO CAP [82263]: 200 mg | ORAL | @ 02:00:00 | Stop: 2021-12-16 | NDC 00904650261

## 2021-12-14 MED ADMIN — ACETAMINOPHEN 500 MG PO TAB [102]: 1000 mg | ORAL | @ 14:00:00 | Stop: 2021-12-15 | NDC 00904673061

## 2021-12-14 MED ADMIN — DOCUSATE SODIUM 100 MG PO CAP [2566]: 200 mg | ORAL | @ 14:00:00 | NDC 00904718361

## 2021-12-14 MED ADMIN — GABAPENTIN 300 MG PO CAP [18308]: 300 mg | ORAL | @ 14:00:00 | Stop: 2021-12-15 | NDC 00904666661

## 2021-12-14 MED ADMIN — METHOCARBAMOL 750 MG PO TAB [4972]: 750 mg | ORAL | @ 02:00:00 | NDC 70010077005

## 2021-12-14 MED ADMIN — DOCUSATE SODIUM 100 MG PO CAP [2566]: 200 mg | ORAL | @ 02:00:00 | NDC 00904718361

## 2021-12-14 MED ADMIN — ONDANSETRON HCL (PF) 4 MG/2 ML IJ SOLN [136012]: 4 mg | INTRAVENOUS | @ 19:00:00 | NDC 00641607801

## 2021-12-14 MED ADMIN — ACETAMINOPHEN 500 MG PO TAB [102]: 1000 mg | ORAL | @ 22:00:00 | Stop: 2021-12-15 | NDC 00904673061

## 2021-12-14 MED ADMIN — GABAPENTIN 300 MG PO CAP [18308]: 300 mg | ORAL | @ 22:00:00 | Stop: 2021-12-15 | NDC 00904666661

## 2021-12-14 MED ADMIN — PILOCARPINE HCL 5 MG PO TAB [12803]: 5 mg | ORAL | @ 02:00:00 | NDC 68084092895

## 2021-12-14 MED ADMIN — METHOCARBAMOL 750 MG PO TAB [4972]: 750 mg | ORAL | @ 14:00:00 | NDC 70010077005

## 2021-12-14 MED ADMIN — ACETAMINOPHEN 500 MG PO TAB [102]: 1000 mg | ORAL | @ 05:00:00 | Stop: 2021-12-15 | NDC 00904673061

## 2021-12-14 MED ADMIN — DULOXETINE 30 MG PO CPDR [93424]: 30 mg | ORAL | @ 14:00:00 | NDC 57237001890

## 2021-12-14 MED ADMIN — PILOCARPINE HCL 5 MG PO TAB [12803]: 5 mg | ORAL | @ 17:00:00 | NDC 68084092895

## 2021-12-14 MED ADMIN — POLYETHYLENE GLYCOL 3350 17 GRAM PO PWPK [25424]: 17 g | ORAL | @ 14:00:00 | NDC 00904693186

## 2021-12-14 MED ADMIN — POLYETHYLENE GLYCOL 3350 17 GRAM PO PWPK [25424]: 17 g | ORAL | @ 02:00:00 | NDC 00904693186

## 2021-12-14 MED ADMIN — GABAPENTIN 300 MG PO CAP [18308]: 300 mg | ORAL | @ 02:00:00 | Stop: 2021-12-15 | NDC 00904666661

## 2021-12-14 MED ADMIN — ENOXAPARIN 40 MG/0.4 ML SC SYRG [85052]: 40 mg | SUBCUTANEOUS | @ 02:00:00 | NDC 00781324602

## 2021-12-14 MED ADMIN — PILOCARPINE HCL 5 MG PO TAB [12803]: 5 mg | ORAL | @ 22:00:00 | NDC 68084092895

## 2021-12-15 ENCOUNTER — Encounter: Admit: 2021-12-15 | Discharge: 2021-12-15 | Payer: BC Managed Care – PPO

## 2021-12-15 MED ORDER — BACITRACIN 500 UNIT/GRAM TP OINT
Freq: Every day | TOPICAL | 0 refills | 30.00000 days | Status: AC
Start: 2021-12-15 — End: ?

## 2021-12-15 MED ADMIN — GABAPENTIN 300 MG PO CAP [18308]: 300 mg | ORAL | @ 02:00:00 | Stop: 2021-12-15 | NDC 00904666661

## 2021-12-15 MED ADMIN — DOCUSATE SODIUM 100 MG PO CAP [2566]: 200 mg | ORAL | @ 14:00:00 | Stop: 2021-12-15 | NDC 00904718361

## 2021-12-15 MED ADMIN — METHOCARBAMOL 750 MG PO TAB [4972]: 750 mg | ORAL | @ 14:00:00 | Stop: 2021-12-15 | NDC 70010077005

## 2021-12-15 MED ADMIN — ACETAMINOPHEN 500 MG PO TAB [102]: 1000 mg | ORAL | @ 14:00:00 | Stop: 2021-12-15 | NDC 00904673061

## 2021-12-15 MED ADMIN — METHOCARBAMOL 750 MG PO TAB [4972]: 750 mg | ORAL | @ 02:00:00 | NDC 70010077005

## 2021-12-15 MED ADMIN — POLYETHYLENE GLYCOL 3350 17 GRAM PO PWPK [25424]: 17 g | ORAL | @ 14:00:00 | Stop: 2021-12-15 | NDC 00904693186

## 2021-12-15 MED ADMIN — DOCUSATE SODIUM 100 MG PO CAP [2566]: 200 mg | ORAL | @ 02:00:00 | NDC 00904718361

## 2021-12-15 MED ADMIN — PILOCARPINE HCL 5 MG PO TAB [12803]: 5 mg | ORAL | @ 14:00:00 | Stop: 2021-12-15 | NDC 68084092895

## 2021-12-15 MED ADMIN — OXYCODONE 5 MG PO TAB [10814]: 5 mg | ORAL | @ 07:00:00 | Stop: 2021-12-15 | NDC 00406055223

## 2021-12-15 MED ADMIN — ACETAMINOPHEN 500 MG PO TAB [102]: 1000 mg | ORAL | @ 07:00:00 | Stop: 2021-12-15 | NDC 00904673061

## 2021-12-15 MED ADMIN — CELECOXIB 100 MG PO CAP [82263]: 200 mg | ORAL | @ 14:00:00 | Stop: 2021-12-15 | NDC 00904650261

## 2021-12-15 MED ADMIN — OXYCODONE 5 MG PO TAB [10814]: 10 mg | ORAL | @ 17:00:00 | Stop: 2021-12-15 | NDC 00406055223

## 2021-12-15 MED ADMIN — CELECOXIB 100 MG PO CAP [82263]: 200 mg | ORAL | @ 02:00:00 | Stop: 2021-12-16 | NDC 00904650261

## 2021-12-15 MED ADMIN — DULOXETINE 30 MG PO CPDR [93424]: 30 mg | ORAL | @ 14:00:00 | Stop: 2021-12-15 | NDC 00904704461

## 2021-12-15 MED ADMIN — ENOXAPARIN 40 MG/0.4 ML SC SYRG [85052]: 40 mg | SUBCUTANEOUS | @ 02:00:00 | NDC 00781324602

## 2021-12-15 MED ADMIN — PILOCARPINE HCL 5 MG PO TAB [12803]: 5 mg | ORAL | @ 02:00:00 | NDC 68084092895

## 2021-12-15 MED ADMIN — POLYETHYLENE GLYCOL 3350 17 GRAM PO PWPK [25424]: 17 g | ORAL | @ 02:00:00 | NDC 00904693186

## 2021-12-15 MED ADMIN — GABAPENTIN 300 MG PO CAP [18308]: 300 mg | ORAL | @ 14:00:00 | Stop: 2021-12-15 | NDC 00904666661

## 2021-12-15 MED FILL — METHOCARBAMOL 750 MG PO TAB: 750 mg | ORAL | 5 days supply | Qty: 10 | Fill #1 | Status: CP

## 2021-12-15 MED FILL — OXYCODONE 5 MG PO TAB: 5 mg | ORAL | 7 days supply | Qty: 25 | Fill #1 | Status: CP

## 2021-12-16 ENCOUNTER — Encounter: Admit: 2021-12-16 | Discharge: 2021-12-16 | Payer: BC Managed Care – PPO

## 2021-12-16 NOTE — Telephone Encounter
Janice Gregory had RN paged. She reports her husband presented to [facility] in [place], and they did not have the bacitracin ointment that was sent last night by oncall provider.     I called [facility] and they confirmed they have not received Rx. I provided verbal Rx to pharmacist and messaged patient via MyChart that Rx has been provided/updated. Pt advised to call prior to arrival to confirm they have Rx ready.

## 2021-12-20 ENCOUNTER — Encounter: Admit: 2021-12-20 | Discharge: 2021-12-20 | Payer: BC Managed Care – PPO

## 2021-12-24 ENCOUNTER — Encounter: Admit: 2021-12-24 | Discharge: 2021-12-24 | Payer: BC Managed Care – PPO

## 2021-12-24 DIAGNOSIS — N952 Postmenopausal atrophic vaginitis: Secondary | ICD-10-CM

## 2021-12-24 MED ORDER — ESTRADIOL 0.01 % (0.1 MG/GRAM) VA CREA
2 refills
Start: 2021-12-24 — End: ?

## 2021-12-29 NOTE — Progress Notes
.  This prechart is intended to be a reference for patient appointments. Information is gathered from in chart as well as external records review. Information will be clarified/verified/updated in final documentation in the office visit.     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 She subsequently developed a symptomatic parastomal hernia for which arobotic parastomal hernia repair with mesh (modified Sugarbaker)was performedon 06/20/21.  She recovered well from the hernia surgery and is very happy with the colostomy but has developed significant life-altering mucus drainage from the retained rectum. She underwent a robotic abdominoperineal resection on 12/12/2021. Pathology consistent with diverticulosis and colitis.     Problem List:      12/12/2021 - 1) Laparoscopic/robotic lysis of adhesions (~90 minutes) 2) Robotic abdominoperineal resection 3) Bilateral pudendal nerve block  Findings:  Extensive intraabdominal adhesions - omentum and small bowel - to prior midline incision and parastomal hernia repair mesh, completion proctectomy performed  Pathology:  A. Abdominoperineal resection:    Segment of colon with diverticulosis, focal peridiverticular fibrosis   and active colitis with cryptitis.   Twelve lymph nodes, negative for carcinoma.

## 2021-12-30 ENCOUNTER — Encounter: Admit: 2021-12-30 | Discharge: 2021-12-30 | Payer: BC Managed Care – PPO

## 2021-12-30 DIAGNOSIS — K6289 Other specified diseases of anus and rectum: Secondary | ICD-10-CM

## 2021-12-30 MED ORDER — TRAMADOL 50 MG PO TAB
50 mg | ORAL_TABLET | ORAL | 0 refills | Status: AC | PRN
Start: 2021-12-30 — End: ?

## 2022-01-04 ENCOUNTER — Encounter: Admit: 2022-01-04 | Discharge: 2022-01-04 | Payer: BC Managed Care – PPO

## 2022-01-04 DIAGNOSIS — M1990 Unspecified osteoarthritis, unspecified site: Secondary | ICD-10-CM

## 2022-01-04 DIAGNOSIS — K6289 Other specified diseases of anus and rectum: Secondary | ICD-10-CM

## 2022-01-04 DIAGNOSIS — M359 Systemic involvement of connective tissue, unspecified: Secondary | ICD-10-CM

## 2022-01-04 DIAGNOSIS — T8859XA Other complications of anesthesia, initial encounter: Secondary | ICD-10-CM

## 2022-01-04 DIAGNOSIS — B999 Unspecified infectious disease: Secondary | ICD-10-CM

## 2022-01-04 MED ORDER — GABAPENTIN 300 MG PO CAP
300 mg | ORAL_CAPSULE | Freq: Two times a day (BID) | ORAL | 0 refills | Status: AC
Start: 2022-01-04 — End: ?

## 2022-01-04 MED ORDER — TRAMADOL 50 MG PO TAB
50 mg | ORAL_TABLET | ORAL | 0 refills | Status: AC | PRN
Start: 2022-01-04 — End: ?

## 2022-01-04 MED ORDER — METHOCARBAMOL 750 MG PO TAB
750 mg | ORAL_TABLET | Freq: Two times a day (BID) | ORAL | 0 refills | Status: AC
Start: 2022-01-04 — End: ?

## 2022-01-04 NOTE — Progress Notes
Date of Service: 01/04/2022    Subjective:             Janice Gregory is a 61 y.o. female.    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 ?She subsequently developed a symptomatic parastomal hernia for which a?robotic parastomal hernia repair with mesh (modified Sugarbaker)?was performed?on 06/20/21.  She recovered well from the hernia surgery and is very happy with the colostomy but has developed significant life-altering mucus drainage from the retained rectum. She underwent a robotic abdominoperineal resection on 12/12/2021. Pathology consistent with diverticulosis and colitis.     She is having perineal pain. She requests more tramadol. She is needing 3 a day. She is also taking her chronic Robaxin 500mg  daily and Gabapentin 300mg  nightly. Not using anything topical to the wound for pain. Has been constipated the last few days. Took 1 ducloax and then 2 ducolax without results. Wearing pad to absorb perineal drainage.     12/12/2021 - 1) Laparoscopic/robotic lysis of adhesions (~90 minutes) 2) Robotic abdominoperineal resection 3) Bilateral pudendal nerve block  Findings:  Extensive intraabdominal adhesions - omentum and small bowel - to prior midline incision and parastomal hernia repair mesh, completion proctectomy performed  Pathology:  A. Abdominoperineal resection: ?   Segment of colon with diverticulosis, focal peridiverticular fibrosis   and active colitis with cryptitis.   Twelve lymph nodes, negative for carcinoma.     Medical History:   Diagnosis Date   ? Arthritis    ? Complication of anesthesia    ? Connective tissue disease (HCC)    ? Infection      Surgical History:   Procedure Laterality Date   ? ANORECTAL MANOMETRY N/A 11/11/2019    Performed by Eliott Nine, MD at [facility] ENDO   ? LAPAROSCOPIC COLOSTOMY N/A 12/15/2019    Performed by Benetta Spar, MD at [facility] OR   ? ROBOT ASSISTED REPAIR PARASTOMAL HERNIA WITH MESH N/A 06/20/2021    Performed by Benetta Spar, MD at [facility] OR   ? ROBOT-ASSISTED COMPLETION PROCTECTOMY N/A 12/12/2021    Performed by Benetta Spar, MD at [facility] OR   ? ARTHROPLASTY     ? FOOT SURGERY     ? HERNIA REPAIR     ? HX APPENDECTOMY     ? HX CHOLECYSTECTOMY     ? HX HEMORRHOIDECTOMY     ? HX HYSTERECTOMY     ? HX JOINT REPLACEMENT     ? HYSTERECTOMY     ? KNEE SURGERY       Family History   Problem Relation Age of Onset   ? Osteoporosis Mother    ? Arthritis Mother    ? Heart Attack Father    ? Osteoporosis Sibling    ? Arthritis Sibling    ? Scoliosis Sibling      Social History     Socioeconomic History   ? Marital status: Married   Tobacco Use   ? Smoking status: Former     Types: Cigarettes     Quit date: 05/08/1977  Years since quitting: 44.6   ? Smokeless tobacco: Never   Vaping Use   ? Vaping Use: Never used   Substance and Sexual Activity   ? Alcohol use: Yes     Comment: rarely   ? Drug use: Never   ? Sexual activity: Not Currently     Partners: Male     Vaping/E-liquid Use   ? Vaping Use Never User                    Review of Systems   Constitutional: Negative.  Negative for chills, fever and unexpected weight change.   HENT: Negative.    Eyes: Negative.    Respiratory: Negative.    Cardiovascular: Negative.    Gastrointestinal: Negative.  Negative for abdominal distention, abdominal pain, anal bleeding, blood in stool, constipation, diarrhea, nausea, rectal pain and vomiting. Endocrine: Negative.    Genitourinary: Negative.    Musculoskeletal: Negative.    Skin: Negative.  Negative for color change and wound.   Allergic/Immunologic: Negative.    Neurological: Negative.  Negative for weakness and light-headedness.   Hematological: Negative.    Psychiatric/Behavioral: Negative.          Objective:         ? acetaminophen (TYLENOL EXTRA STRENGTH) 500 mg tablet Take two tablets by mouth every 6 hours as needed. Max of 4,000 mg of acetaminophen in 24 hours.   ? albuterol sulfate (PROAIR HFA) 90 mcg/actuation HFA aerosol inhaler Inhale two puffs by mouth into the lungs every 4 hours as needed. Shake well before use.   ? ascorbic acid (VITAMIN C PO) Take  by mouth.   ? bacitracin (BACTERICIN) 500 unit/gram topical ointment Apply  topically to affected area daily.   ? COLLAGEN MISC Take 1 capsule by mouth daily.   ? docosahexaenoic acid/epa (FISH OIL PO) Take  by mouth. (Patient not taking: Reported on 11/17/2021)   ? docusate sodium (COLACE PO) Take 200 mg by mouth twice daily.   ? duloxetine DR (CYMBALTA) 30 mg capsule Take one capsule by mouth daily.   ? estradioL (ESTRACE) 0.01 % (0.1 mg/g) vaginal cream Insert or apply 1 gram to the vaginal area three times weekly.   ? hydrocortisone (HYTONE) 2.5 % topical cream Apply  topically to affected area twice daily. (Patient taking differently: Apply  topically to affected area twice daily as needed.)   ? ibuprofen (ADVIL) 200 mg tablet Take one tablet to two tablets by mouth every 6 hours as needed. Take with food.   ? Lactobac no.41/Bifidobact no.7 (PROBIOTIC-10 PO) Take 1 capsule by mouth daily. 20 Billion    ? magnesium citrate oral solution Take 296 mL by mouth once.   ? melatonin 10 mg capsule    ? MULTIVITAMIN PO Take  by mouth.   ? nystatin (MYCOSTATIN) 100,000 unit/g topical cream Apply  topically to affected area twice daily as needed (Rash).   ? nystatin-triamcinolone 100,000 unit/g / 0.1 % topical cream Apply  topically to affected area four times daily.   ? pilocarpine HCL (SALAGEN) 5 mg tablet Take one tablet by mouth four times daily.   ? polyethylene glycol 3350 (MIRALAX) 17 g packet Take one packet by mouth twice daily.   ? safflower oil/linoleic acid,co (CLA PO) Take 1,300 mg by mouth twice daily.   ? simethicone (GAS-X PO) Take 2 tablets by mouth at bedtime daily.   ? sodium chloride (MURO-5) 5 % ophthalmic solution Apply one drop to both eyes  as Needed.   ? traMADoL (ULTRAM) 50 mg tablet Take one tablet by mouth every 8 hours as needed for Pain.   ? vitamins, B complex (B-COMPLEX) tablet      Vitals:    01/04/22 1340   PainSc: Eight   Weight: 76.5 kg (168 lb 9.6 oz)   Height: 175.3 cm (5' 9)     Body mass index is 24.9 kg/m?Marland Kitchen     Physical Exam  Vitals reviewed.   Constitutional:       General: She is not in acute distress.     Appearance: Normal appearance. She is well-developed.   HENT:      Head: Normocephalic and atraumatic.      Nose: Nose normal.   Eyes:      General: Lids are normal.      Conjunctiva/sclera: Conjunctivae normal.   Pulmonary:      Effort: Pulmonary effort is normal. No respiratory distress.   Genitourinary:      Musculoskeletal:         General: Normal range of motion.      Cervical back: Normal range of motion.   Neurological:      Mental Status: She is alert and oriented to person, place, and time.   Psychiatric:         Speech: Speech normal.         Behavior: Behavior normal.         Thought Content: Thought content normal.         Judgment: Judgment normal.              Assessment and Plan:  61yo female s/p robotic abdominoperineal resection on 12/12/2021. Having significant perineal pain.     Post-Operative Care:  - Reviewed that the patient has a 10lb weightlifting restriction for 6wks from the date of surgery. I warned the patient of the risk of hernia if lifting too soon. However, encouraged aerobic activity.  - Discussed pain management strategies at this point post operatively.    - Wound care reviewed. Can use bacitracin and topical lidocaine mixed.   - We had a long discussion about post operative pain. She should not be needing Tramadol at this point. For the next two week she will take:  1. Gabapentin 300mg  TID.   2. Robaxin 750mg  BID.   3. Tramadol 50mg  q8 PRN.   - We will not be refilling the tramadol after this two weeks. I told her this.     Perineal Wound:  - Looks good. Can use bacitracin and    Constipation:  - Use MOM every 2-3hrs until things start moving then stop.   - Continue Miralax BID.    Plan:  - RTC 4wks with APP via telehealth.   - RTC 8wks with Dr. Daphine Deutscher in person.     .The patient and family were allowed to ask questions and voice concerns; these were addressed to the best of our ability. They expressed understanding of what was explained to them, and they agreed with the present plan. Patient has the phone numbers for the Cancer Center and was instructed on how to contact us with any questions or concerns.    My collaborating provider on this patient is Dr. Deedra Ehrich MD.    Brunilda Payor. Fredricka Bonine MSN, APRN, AGCNS-BC, AGPCNP-BC  Advanced Practice Provider  Colon and Rectal Surgery  Surgical Oncology  APP for Dr. Kathee Delton, Dr. Daphine Deutscher and Dr. Daiva Nakayama  Pager: [phone]  Available via Amie Critchley and [facility]  Connect

## 2022-01-05 ENCOUNTER — Encounter: Admit: 2022-01-05 | Discharge: 2022-01-05 | Payer: BC Managed Care – PPO

## 2022-01-05 NOTE — Progress Notes
Submitted PA request through covermymeds for patient's Tramadol. Key: [id number].  Awaiting repsonse.

## 2022-01-26 NOTE — Progress Notes
.  This prechart is intended to be a reference for patient appointments. Information is gathered from in chart as well as external records review. Information will be clarified/verified/updated in final documentation in the office visit.     Pre Clinic Pre Chart:    Janice Gregory is a 61 year old female with history of sicca syndrome, possible Sjogren's, chronic constipation, fibromyalgia, and multiple pelvic surgeries: traumatic SVD '81 requiring primary repair, ex-lap/appendectomy after hysterectomy '97, A/P vaginal repair '00, rectocele repair/rectopexy/sphincter repair/transvaginal sling '04 for rectal prolapse, excisional hemorrhoidectomy '09, Delorme procedure 07/10/16 for recurrent prolapse, and excisional hemorrhoidectomy again 08/07/16.Due to significant fecal incontinence she underwent a laparoscopic lysis of adhesions 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 She subsequently developed a symptomatic parastomal hernia for which arobotic parastomal hernia repair with mesh (modified Sugarbaker)was performedon 06/20/21.  She recovered well from the hernia surgery and is very happy with the colostomy but has developed significant life-altering mucus drainage from the retained rectum. She underwent a robotic abdominoperineal resection on 12/12/2021. Pathology consistent with diverticulosis and colitis.     Problem List:      12/12/2021 - 1) Laparoscopic/robotic lysis of adhesions (~90 minutes) 2) Robotic abdominoperineal resection 3) Bilateral pudendal nerve block  Findings:  Extensive intraabdominal adhesions - omentum and small bowel - to prior midline incision and parastomal hernia repair mesh, completion proctectomy performed  Pathology:  A. Abdominoperineal resection:    Segment of colon with diverticulosis, focal peridiverticular fibrosis   and active colitis with cryptitis.   Twelve lymph nodes, negative for carcinoma.

## 2022-01-30 ENCOUNTER — Encounter: Admit: 2022-01-30 | Discharge: 2022-01-30 | Payer: BC Managed Care – PPO

## 2022-01-30 DIAGNOSIS — B999 Unspecified infectious disease: Secondary | ICD-10-CM

## 2022-01-30 DIAGNOSIS — M1990 Unspecified osteoarthritis, unspecified site: Secondary | ICD-10-CM

## 2022-01-30 DIAGNOSIS — K6289 Other specified diseases of anus and rectum: Secondary | ICD-10-CM

## 2022-01-30 DIAGNOSIS — T8859XA Other complications of anesthesia, initial encounter: Secondary | ICD-10-CM

## 2022-01-30 DIAGNOSIS — M359 Systemic involvement of connective tissue, unspecified: Secondary | ICD-10-CM

## 2022-01-30 NOTE — Progress Notes
Telehealth Visit Note    Date of Service: 01/30/2022    Subjective:           Janice Gregory is a 61 y.o. female.    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 ?She subsequently developed a symptomatic parastomal hernia for which a?robotic parastomal hernia repair with mesh (modified Sugarbaker)?was performed?on 06/20/21.  She recovered well from the hernia surgery and is very happy with the colostomy but has developed significant life-altering mucus drainage from the retained rectum. She underwent a robotic abdominoperineal resection on 12/12/2021. Pathology consistent with diverticulosis and colitis.     She returns to clinic today for follow-up.  She continues to report pain with sitting.  She cannot sit very well.  Yesterday she went to church and it almost took everything out of her for the rest of the day.  She is taking 2 gabapentin at night and 2 methocarbamol.  She is also taking ibuprofen and Tylenol.  She does have additional pain in her knee for which she is seeing her primary care provider on the fifth.  They will very likely send her to physical therapy.  She did use Calmoseptine on her perianal skin last night and she did have a good night and morning until she started sitting for the telehealth.  Her sutures amount dissolved.  Her swelling is better.  She still feels the hook of tissue towards the back.  She always catches this when she is getting out of the pickup truck, jeep, or getting up off the toilet seat.  They have been using bacitracin and Neosporin to the spot on the incision and it has closed.  She did have some bloody drainage prior to closing.  Bowels are moving somewhat thick though.  She is taking MiraLAX twice a day to stool softeners at night.    12/12/2021 - 1) Laparoscopic/robotic lysis of adhesions (~90 minutes) 2) Robotic abdominoperineal resection 3) Bilateral pudendal nerve block  Findings:  Extensive intraabdominal adhesions - omentum and small bowel - to prior midline incision and parastomal hernia repair mesh, completion proctectomy performed  Pathology:  A. Abdominoperineal resection: ?   Segment of colon with diverticulosis, focal peridiverticular fibrosis   and active colitis with cryptitis.   Twelve lymph nodes, negative for carcinoma.     Medical History:   Diagnosis Date   ? Arthritis    ? Complication of anesthesia    ? Connective tissue disease (HCC)    ? Infection      Surgical History:   Procedure Laterality Date   ? ANORECTAL MANOMETRY N/A 11/11/2019    Performed by Eliott Nine, MD at [facility] ENDO   ? LAPAROSCOPIC COLOSTOMY N/A 12/15/2019    Performed by Benetta Spar, MD at [facility] OR   ? ROBOT ASSISTED REPAIR PARASTOMAL HERNIA WITH MESH N/A 06/20/2021    Performed by Benetta Spar, MD at [facility] OR   ? ROBOT-ASSISTED COMPLETION PROCTECTOMY N/A 12/12/2021    Performed by Benetta Spar, MD at [facility] OR   ? ARTHROPLASTY     ? FOOT SURGERY     ?  HERNIA REPAIR     ? HX APPENDECTOMY     ? HX CHOLECYSTECTOMY     ? HX HEMORRHOIDECTOMY     ? HX HYSTERECTOMY     ? HX JOINT REPLACEMENT     ? HYSTERECTOMY     ? KNEE SURGERY       Family History   Problem Relation Age of Onset   ? Osteoporosis Mother    ? Arthritis Mother    ? Heart Attack Father    ? Osteoporosis Sibling    ? Arthritis Sibling    ? Scoliosis Sibling      Social History     Socioeconomic History   ? Marital status: Married   Tobacco Use   ? Smoking status: Former     Types: Cigarettes     Quit date: 05/08/1977     Years since quitting: 44.7   ? Smokeless tobacco: Never   Vaping Use   ? Vaping Use: Never used   Substance and Sexual Activity   ? Alcohol use: Yes     Comment: rarely   ? Drug use: Never   ? Sexual activity: Not Currently     Partners: Male     Vaping/E-liquid Use   ? Vaping Use Never User                      Review of Systems   Constitutional: Negative.  Negative for chills, fever and unexpected weight change.   HENT: Negative.    Eyes: Negative.    Respiratory: Negative.    Cardiovascular: Negative.    Gastrointestinal: Negative.  Negative for abdominal distention, abdominal pain, anal bleeding, blood in stool, constipation, diarrhea, nausea, rectal pain and vomiting.   Endocrine: Negative.    Genitourinary: Negative.    Musculoskeletal: Negative.    Skin: Negative.  Negative for color change and wound.   Allergic/Immunologic: Negative.    Neurological: Negative.  Negative for weakness and light-headedness.   Hematological: Negative.    Psychiatric/Behavioral: Negative.        .  Objective:         ? acetaminophen (TYLENOL EXTRA STRENGTH) 500 mg tablet Take two tablets by mouth every 6 hours as needed. Max of 4,000 mg of acetaminophen in 24 hours.   ? albuterol sulfate (PROAIR HFA) 90 mcg/actuation HFA aerosol inhaler Inhale two puffs by mouth into the lungs every 4 hours as needed. Shake well before use.   ? ascorbic acid (VITAMIN C PO) Take  by mouth.   ? bacitracin (BACTERICIN) 500 unit/gram topical ointment Apply  topically to affected area daily.   ? COLLAGEN MISC Take 1 capsule by mouth daily.   ? docosahexaenoic acid/epa (FISH OIL PO) Take  by mouth. (Patient not taking: Reported on 11/17/2021)   ? docusate sodium (COLACE PO) Take 200 mg by mouth twice daily.   ? duloxetine DR (CYMBALTA) 30 mg capsule Take one capsule by mouth daily.   ? estradioL (ESTRACE) 0.01 % (0.1 mg/g) vaginal cream Insert or apply 1 gram to the vaginal area three times weekly.   ? gabapentin (NEURONTIN) 300 mg capsule TAKE 1 CAPSULE BY MOUTH TWICE A DAY FOR 14 DAYS   ? hydrocortisone (HYTONE) 2.5 % topical cream Apply  topically to affected area twice daily. (Patient taking differently: Apply  topically to affected area twice daily as needed.)   ? ibuprofen (ADVIL) 200 mg tablet Take one tablet to two tablets by mouth every 6 hours  as needed. Take with food.   ? Lactobac no.41/Bifidobact no.7 (PROBIOTIC-10 PO) Take 1 capsule by mouth daily. 20 Billion    ? magnesium citrate oral solution Take 296 mL by mouth once.   ? melatonin 10 mg capsule    ? methocarbamoL (ROBAXIN) 500 mg tablet    ? MULTIVITAMIN PO Take  by mouth.   ? nystatin (MYCOSTATIN) 100,000 unit/g topical cream Apply  topically to affected area twice daily as needed (Rash).   ? nystatin-triamcinolone 100,000 unit/g / 0.1 % topical cream Apply  topically to affected area four times daily.   ? pilocarpine HCL (SALAGEN) 5 mg tablet Take one tablet by mouth four times daily.   ? polyethylene glycol 3350 (MIRALAX) 17 g packet Take one packet by mouth twice daily.   ? safflower oil/linoleic acid,co (CLA PO) Take 1,300 mg by mouth twice daily.   ? simethicone (GAS-X PO) Take 2 tablets by mouth at bedtime daily.   ? sodium chloride (MURO-5) 5 % ophthalmic solution Apply one drop to both eyes as Needed.   ? traMADoL (ULTRAM) 50 mg tablet Take one tablet by mouth every 8 hours as needed for Pain.   ? vitamins, B complex (B-COMPLEX) tablet           Telehealth Patient Reported Vitals     Row Name 01/30/22 1102                Pain Score: Five        Pain Location: BUTTOCKS                  Telehealth Body Mass Index: 0 at 01/30/2022 11:03 AM    Physical Exam  Vitals reviewed.   Constitutional:       General: She is not in acute distress.     Appearance: Normal appearance. She is well-developed.   HENT:      Head: Normocephalic and atraumatic.      Nose: Nose normal.   Eyes:      General: Lids are normal. Conjunctiva/sclera: Conjunctivae normal.   Pulmonary:      Effort: Pulmonary effort is normal. No respiratory distress.   Musculoskeletal:         General: Normal range of motion.      Cervical back: Normal range of motion.   Neurological:      Mental Status: She is alert and oriented to person, place, and time.   Psychiatric:         Speech: Speech normal.         Behavior: Behavior normal.         Thought Content: Thought content normal.         Judgment: Judgment normal.              Assessment and Plan:  61yo female s/p robotic abdominoperineal resection on 12/12/2021. Pathology consistent with diverticulosis and colitis. Things are slowly improving.     Constipation:  - Start Senna+ BID.   - Continue Miralax BID.     Perineal Pain:  - Will slowly improve with time as it already has been.     Knee Pain:  - Seeing PCP and likely will do therapy.   - Getting moving and being more active will also likely help the perineal pain.    Plan:  - RTC as scheduled.     The patient and family were allowed to ask questions and voice concerns; these were addressed to the best of our  ability. They expressed understanding of what was explained to them, and they agreed with the present plan. Patient has the phone numbers for the Cancer Center and was instructed on how to contact us with any questions or concerns.    My collaborating provider on this patient is Dr. Jonny Ruiz Ashcraft DO.    Brunilda Payor. Fredricka Bonine MSN, APRN, AGCNS-BC, AGPCNP-BC  Advanced Practice Provider  Colon and Rectal Surgery  Surgical Oncology  APP for Dr. Kathee Delton, Dr. Daphine Deutscher and Dr. Daiva Nakayama  Pager: [phone]  Available via Amie Critchley and [facility] Connect                           15 minutes spent on this patient's encounter with counseling and coordination of care taking >50% of the visit.

## 2022-03-07 ENCOUNTER — Encounter: Admit: 2022-03-07 | Discharge: 2022-03-07 | Payer: BC Managed Care – PPO

## 2022-03-07 DIAGNOSIS — Z933 Colostomy status: Secondary | ICD-10-CM

## 2022-03-07 DIAGNOSIS — M359 Systemic involvement of connective tissue, unspecified: Secondary | ICD-10-CM

## 2022-03-07 DIAGNOSIS — T8859XA Other complications of anesthesia, initial encounter: Secondary | ICD-10-CM

## 2022-03-07 DIAGNOSIS — K6289 Other specified diseases of anus and rectum: Secondary | ICD-10-CM

## 2022-03-07 DIAGNOSIS — B999 Unspecified infectious disease: Secondary | ICD-10-CM

## 2022-03-07 DIAGNOSIS — M1990 Unspecified osteoarthritis, unspecified site: Secondary | ICD-10-CM

## 2022-03-29 ENCOUNTER — Encounter: Admit: 2022-03-29 | Discharge: 2022-03-29 | Payer: BC Managed Care – PPO

## 2022-03-29 DIAGNOSIS — N3010 Interstitial cystitis (chronic) without hematuria: Secondary | ICD-10-CM

## 2022-03-29 MED ORDER — AMITRIPTYLINE 25 MG PO TAB
ORAL_TABLET | 2 refills
Start: 2022-03-29 — End: ?

## 2022-05-10 ENCOUNTER — Encounter: Admit: 2022-05-10 | Discharge: 2022-05-10 | Payer: BC Managed Care – PPO

## 2022-06-15 ENCOUNTER — Encounter: Admit: 2022-06-15 | Discharge: 2022-06-15 | Payer: BC Managed Care – PPO

## 2022-06-20 ENCOUNTER — Encounter: Admit: 2022-06-20 | Discharge: 2022-06-20 | Payer: BC Managed Care – PPO

## 2022-06-20 DIAGNOSIS — Z933 Colostomy status: Secondary | ICD-10-CM

## 2022-06-20 DIAGNOSIS — K6289 Other specified diseases of anus and rectum: Secondary | ICD-10-CM

## 2022-06-20 NOTE — Progress Notes
Name: Janice Gregory          MRN: [id number]      DOB: Nov 24, 1960      AGE: 62 y.o.   DATE OF SERVICE: 06/20/2022    Subjective:             Reason for Visit:  Followup    History of Present Illness  Janice Gregory is a 62 year old female with history of sicca syndrome, possible Sjogren's, chronic constipation, fibromyalgia, and multiple pelvic surgeries: traumatic SVD '81 requiring primary repair, ex-lap/appendectomy after hysterectomy '97, A/P vaginal repair '00, rectocele repair/rectopexy/sphincter repair/transvaginal sling '04 for rectal prolapse, excisional hemorrhoidectomy '09, Delorme procedure 07/10/16 for recurrent prolapse, and excisional hemorrhoidectomy again 08/07/16.  Due to significant fecal incontinence she underwent a laparoscopic lysis of adhesions 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  She subsequently developed a symptomatic parastomal hernia - soreness and intermittent obstruction for which a robotic parastomal hernia repair with mesh (modified Sugarbaker) was performed on 06/20/21.  She recovered well from the hernia surgery and is very happy with the colostomy but has developed significant life-altering mucus drainage from the retained rectum. She underwent a robotic abdominoperineal resection on 12/12/2021. Pathology consistent with diverticulosis and colitis. She returned to clinic on 01/04/22 with a well healing wound and concerns of a pulling sensation/pain with sitting, standing, and walking in the perineal area.     Interval History:  Today she reports significant improvement following the APR. They note an unexpected positive change in their sexual satisfaction post-surgery. They also report a resolution of a previous 'tugging sensation' at the incision site, although they continue to experience discomfort when sitting on hard surfaces. They have found relief by using a neck pillow for sitting.    The patient has been managing bowel movements with a regimen of 4 tablets of seena at night and Miralax twice daily, which they report is working well. They have been able to maintain seven to eight-day changes with their colostomy appliance, with no sores or complications. However, they report a persistent yeast infection under a skin fold.    The patient also reports a tender spot on their head, which has been causing significant discomfort. They are scheduled to see a dermatologist for this issue. They also report worsening short-term memory, weakness in their legs, and easy fatigability, which they attribute to their current medications. They express a desire to wean off these medications.    The patient has been working out using a Bowflex, recumbent bike, and an elevator, but reports muscle tightness and lack of energy. They also report chronic dry eyes. They express concern about a medication they were prescribed potentially causing glaucoma and worsening their dry eyes. They are in the process of discontinuing this medication.       Medical History:   Diagnosis Date    Arthritis     Complication of anesthesia     Connective tissue disease (HCC)     Infection      Surgical History:   Procedure Laterality Date    ANORECTAL MANOMETRY N/A 11/11/2019    Performed by Eliott Nine, MD at [facility] ENDO    LAPAROSCOPIC COLOSTOMY N/A 12/15/2019    Performed by Benetta Spar, MD at [facility] OR    ROBOT ASSISTED REPAIR PARASTOMAL HERNIA WITH MESH N/A 06/20/2021    Performed by Benetta Spar, MD at [facility] OR    ROBOT-ASSISTED COMPLETION PROCTECTOMY N/A 12/12/2021    Performed by Benetta Spar, MD at [facility] OR  ARTHROPLASTY      FOOT SURGERY      HERNIA REPAIR      HX APPENDECTOMY      HX CHOLECYSTECTOMY      HX HEMORRHOIDECTOMY      HX HYSTERECTOMY      HX JOINT REPLACEMENT      HYSTERECTOMY      KNEE SURGERY       Family History   Problem Relation Age of Onset    Osteoporosis Mother     Arthritis Mother     Heart Attack Father     Osteoporosis Sibling     Arthritis Sibling     Scoliosis Sibling      Social History Socioeconomic History    Marital status: Married   Tobacco Use    Smoking status: Former     Types: Cigarettes     Quit date: 05/08/1977     Years since quitting: 45.1    Smokeless tobacco: Never   Vaping Use    Vaping Use: Never used   Substance and Sexual Activity    Alcohol use: Yes     Comment: rarely    Drug use: Never    Sexual activity: Not Currently     Partners: Male     Review of Systems    Objective:          acetaminophen (TYLENOL EXTRA STRENGTH) 500 mg tablet Take two tablets by mouth every 6 hours as needed. Max of 4,000 mg of acetaminophen in 24 hours.    albuterol sulfate (PROAIR HFA) 90 mcg/actuation HFA aerosol inhaler Inhale two puffs by mouth into the lungs every 4 hours as needed. Shake well before use.    ascorbic acid (VITAMIN C PO) Take  by mouth.    bacitracin (BACTERICIN) 500 unit/gram topical ointment Apply  topically to affected area daily.    COLLAGEN MISC Take 1 capsule by mouth daily.    docosahexaenoic acid/epa (FISH OIL PO) Take  by mouth. (Patient not taking: Reported on 11/17/2021)    docusate sodium (COLACE PO) Take 200 mg by mouth twice daily.    duloxetine DR (CYMBALTA) 30 mg capsule Take one capsule by mouth daily.    estradioL (ESTRACE) 0.01 % (0.1 mg/g) vaginal cream Insert or apply 1 gram to the vaginal area three times weekly.    gabapentin (NEURONTIN) 300 mg capsule TAKE 1 CAPSULE BY MOUTH TWICE A DAY FOR 14 DAYS    hydrocortisone (HYTONE) 2.5 % topical cream Apply  topically to affected area twice daily. (Patient taking differently: Apply  topically to affected area twice daily as needed.)    ibuprofen (ADVIL) 200 mg tablet Take one tablet to two tablets by mouth every 6 hours as needed. Take with food.    Lactobac no.41/Bifidobact no.7 (PROBIOTIC-10 PO) Take 1 capsule by mouth daily. 20 Billion     magnesium citrate oral solution Take 296 mL by mouth once.    melatonin 10 mg capsule     methocarbamoL (ROBAXIN) 500 mg tablet     MULTIVITAMIN PO Take  by mouth. nystatin (MYCOSTATIN) 100,000 unit/g topical cream Apply  topically to affected area twice daily as needed (Rash).    nystatin-triamcinolone 100,000 unit/g / 0.1 % topical cream Apply  topically to affected area four times daily.    pilocarpine HCL (SALAGEN) 5 mg tablet Take one tablet by mouth four times daily.    polyethylene glycol 3350 (MIRALAX) 17 g packet Take one packet by mouth twice daily.  safflower oil/linoleic acid,co (CLA PO) Take 1,300 mg by mouth twice daily.    simethicone (GAS-X PO) Take 2 tablets by mouth at bedtime daily.    sodium chloride (MURO-5) 5 % ophthalmic solution Apply one drop to both eyes as Needed.    traMADoL (ULTRAM) 50 mg tablet Take one tablet by mouth every 8 hours as needed for Pain.    vitamins, B complex (B-COMPLEX) tablet      Vitals:    06/20/22 0757   PainSc: Zero     There is no height or weight on file to calculate BMI.     Pain Score: Zero     Fatigue Scale: 0-None    Pain Addressed:  N/A    Patient Evaluated for a Clinical Trial: No treatment clinical trial available for this patient.     [place] Cooperative Oncology Group performance status is 0, Fully active, able to carry on all pre-disease performance without restriction.Marland Kitchen     Physical Exam   Not performed due to telehealth     Assessment and Plan:  Post-surgical recovery: Patient reports significant improvement following surgery, with unexpected positive changes in sexual satisfaction.  - No specific plan needed as patient is satisfied with current status.    Skin lesion on head: Patient reports a painful lesion on the head, causing discomfort and affecting sleep.  - Seeing a dermatologist in [place] in the next few weeks for further evaluation and management.    Colostomy care: Patient reports successful 7-8 day changes with no sores or complications.  - Continue current colostomy care regimen.    Yeast infection under skin fold: Patient reports a recurrent yeast infection under a skin fold.  - Plan to discuss with dermatologist during upcoming visit for potential solutions, including possible surgical intervention.    Fibromyalgia and Sjogren's syndrome: Patient reports discontinuing medications due to side effects and is seeking care from a neurologist.  - Encourage patient to continue follow-up with rheumatologist and upcoming neurologist appointment.    Follow-up in 6 months, or sooner if any issues arise.     Deedra Ehrich, MD  Colon and Rectal Surgery  Pager: [phone]    Total time 25 minutes.  Estimated counseling time 15 minutes.  Counseled regarding colostomy and perineal wound care.

## 2022-09-04 ENCOUNTER — Encounter: Admit: 2022-09-04 | Discharge: 2022-09-04 | Payer: BC Managed Care – PPO

## 2022-12-13 ENCOUNTER — Encounter: Admit: 2022-12-13 | Discharge: 2022-12-13 | Payer: BC Managed Care – PPO

## 2022-12-13 NOTE — Patient Instructions
Thank you for visiting me today. If you have questions or needs following our visit, please feel free to send my team a MyChart message or give my nurse a call. I value your needs and to ensure your needs are met while I am operating, I work with a team. My team consists of my nurse practitioner (Emily Connor), and my nurse, (Jonia Oakey). They assist with reviewing and answering messages Monday through Friday 8am to 4pm. If it is after 4pm, your message may be returned the following business day. Our office is closed on weekends and major holidays. If you have an urgent need after hours, please call [phone] and ask for the surgical oncology resident on call to be paged.      If testing was ordered today please know it may automatically be released to your mychart. You may review this before we do.     Please feel free call my nurse Jameyah Fennewald at [phone] with any questions.     Thank you for trusting our team with your care,      Dr. Benjamin Martin MD  Emily Connor MSN, APRN, AGCNS-BC, AGPCNP-BC  Suzane Vanderweide BSN, RN  Surgical Oncology / Colorectal Surgery  [address], [place], [place] [address]  Phone: [phone] / Fax: [phone]

## 2022-12-19 ENCOUNTER — Encounter: Admit: 2022-12-19 | Discharge: 2022-12-19 | Payer: BC Managed Care – PPO

## 2022-12-19 DIAGNOSIS — Z933 Colostomy status: Secondary | ICD-10-CM

## 2022-12-19 DIAGNOSIS — K6289 Other specified diseases of anus and rectum: Secondary | ICD-10-CM

## 2022-12-25 NOTE — Progress Notes
Name: Janice Gregory          MRN: [id number]      DOB: 02-17-1961      AGE: 62 y.o.   DATE OF SERVICE: 12/19/2022    Subjective:          Reason for Visit:  Telehealth Followup    History of Present Illness  Janice Gregory is a 62 year old female with history of sicca syndrome, possible Sjogren's, chronic constipation, fibromyalgia, and multiple pelvic surgeries: traumatic SVD '81 requiring primary repair, ex-lap/appendectomy after hysterectomy '97, A/P vaginal repair '00, rectocele repair/rectopexy/sphincter repair/transvaginal sling '04 for rectal prolapse, excisional hemorrhoidectomy '09, Delorme procedure 07/10/16 for recurrent prolapse, and excisional hemorrhoidectomy again 08/07/16.  Due to significant fecal incontinence she underwent a laparoscopic lysis of adhesions 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  She subsequently developed a symptomatic parastomal hernia - soreness and intermittent obstruction for which a robotic parastomal hernia repair with mesh (modified Sugarbaker) was performed on 06/20/21.  She recovered well from the hernia surgery and is very happy with the colostomy but has developed significant life-altering mucus drainage from the retained rectum. She underwent a robotic abdominoperineal resection on 12/12/2021. Pathology consistent with diverticulosis and colitis. She returned to clinic on 01/04/22 with a well healing wound and concerns of a pulling sensation/pain with sitting, standing, and walking in the perineal area.      Interval History 06/20/22:  Today she reports significant improvement following the APR. They note an unexpected positive change in their sexual satisfaction post-surgery. They also report a resolution of a previous 'tugging sensation' at the incision site, although they continue to experience discomfort when sitting on hard surfaces. They have found relief by using a neck pillow for sitting.     The patient has been managing bowel movements with a regimen of 4 tablets of seena at night and Miralax twice daily, which they report is working well. They have been able to maintain seven to eight-day changes with their colostomy appliance, with no sores or complications. However, they report a persistent yeast infection under a skin fold.     The patient also reports a tender spot on their head, which has been causing significant discomfort. They are scheduled to see a dermatologist for this issue. They also report worsening short-term memory, weakness in their legs, and easy fatigability, which they attribute to their current medications. They express a desire to wean off these medications.     The patient has been working out using a Bowflex, recumbent bike, and an elevator, but reports muscle tightness and lack of energy. They also report chronic dry eyes. They express concern about a medication they were prescribed potentially causing glaucoma and worsening their dry eyes. They are in the process of discontinuing this medication.    Interval History:  [name] returns in followup and is doing quite well.  She is changing the colostomy appliance every 7 days or so.  No leakage or issues.  Same bowel regimen.  There is no bulge or concern for parastomal hernia.  She underwent cataract surgery ~2 weeks ago.       Past Medical History:   Diagnosis Date    Arthritis     Complication of anesthesia     Connective tissue disease (HCC)     Infection      Surgical History:   Procedure Laterality Date    ANORECTAL MANOMETRY N/A 11/11/2019    Performed by Eliott Nine, MD at [facility] ENDO    LAPAROSCOPIC COLOSTOMY  N/A 12/15/2019    Performed by Benetta Spar, MD at [facility] OR    ROBOT ASSISTED REPAIR PARASTOMAL HERNIA WITH MESH N/A 06/20/2021    Performed by Benetta Spar, MD at [facility] OR    ROBOT-ASSISTED COMPLETION PROCTECTOMY N/A 12/12/2021    Performed by Benetta Spar, MD at [facility] OR    ARTHROPLASTY      FOOT SURGERY      HERNIA REPAIR      HX APPENDECTOMY      HX CHOLECYSTECTOMY      HX HEMORRHOIDECTOMY      HX HYSTERECTOMY      HX JOINT REPLACEMENT      HYSTERECTOMY      KNEE SURGERY       Family History   Problem Relation Name Age of Onset    Osteoporosis Mother      Arthritis Mother      Heart Attack Father      Osteoporosis Sibling      Arthritis Sibling      Scoliosis Sibling       Social History     Socioeconomic History    Marital status: Married   Tobacco Use    Smoking status: Former     Current packs/day: 0.00     Types: Cigarettes     Quit date: 05/08/1977     Years since quitting: 45.6    Smokeless tobacco: Never   Vaping Use    Vaping status: Never Used   Substance and Sexual Activity    Alcohol use: Yes     Comment: rarely    Drug use: Never    Sexual activity: Not Currently     Partners: Male     Review of Systems    Objective:          acetaminophen (TYLENOL EXTRA STRENGTH) 500 mg tablet Take two tablets by mouth every 6 hours as needed. Max of 4,000 mg of acetaminophen in 24 hours.    albuterol sulfate (PROAIR HFA) 90 mcg/actuation HFA aerosol inhaler Inhale two puffs by mouth into the lungs every 4 hours as needed. Shake well before use.    ascorbic acid (VITAMIN C PO) Take  by mouth.    bacitracin (BACTERICIN) 500 unit/gram topical ointment Apply  topically to affected area daily.    coenzyme Q10 50 mg capsule Take one capsule by mouth twice daily.    COLLAGEN MISC Take 1 capsule by mouth daily.    docosahexaenoic acid/epa (FISH OIL PO) Take  by mouth. (Patient not taking: Reported on 11/17/2021)    docusate sodium (COLACE PO) Take 200 mg by mouth twice daily.    duloxetine DR (CYMBALTA) 30 mg capsule Take one capsule by mouth daily.    estradioL (ESTRACE) 0.01 % (0.1 mg/g) vaginal cream Insert or apply 1 gram to the vaginal area three times weekly.    gabapentin (NEURONTIN) 300 mg capsule TAKE 1 CAPSULE BY MOUTH TWICE A DAY FOR 14 DAYS    hydrocortisone (HYTONE) 2.5 % topical cream Apply  topically to affected area twice daily. (Patient taking differently: Apply  topically to affected area twice daily as needed.)    ibuprofen (ADVIL) 200 mg tablet Take one tablet to two tablets by mouth every 6 hours as needed. Take with food.    Lactobac no.41/Bifidobact no.7 (PROBIOTIC-10 PO) Take 1 capsule by mouth daily. 20 Billion     magnesium citrate oral solution Take 296 mL by mouth once. daily  melatonin 10 mg capsule     methocarbamoL (ROBAXIN) 500 mg tablet     MULTIVITAMIN PO Take  by mouth.    nystatin (MYCOSTATIN) 100,000 unit/g topical cream Apply  topically to affected area twice daily as needed (Rash).    nystatin-triamcinolone 100,000 unit/g / 0.1 % topical cream Apply  topically to affected area four times daily.    pilocarpine HCL (SALAGEN) 5 mg tablet Take one tablet by mouth four times daily.    polyethylene glycol 3350 (MIRALAX) 17 g packet Take one packet by mouth twice daily.    safflower oil/linoleic acid,co (CLA PO) Take 1,300 mg by mouth twice daily.    simethicone (GAS-X PO) Take 2 tablets by mouth at bedtime daily.    sodium chloride (MURO-5) 5 % ophthalmic solution Apply one drop to both eyes as Needed.    tiZANidine (ZANAFLEX) 2 mg tablet Take one tablet by mouth nightly as needed.    traMADoL (ULTRAM) 50 mg tablet Take one tablet by mouth every 8 hours as needed for Pain.    vitamins, B complex (B-COMPLEX) tablet      There were no vitals filed for this visit.  There is no height or weight on file to calculate BMI.     Pain Addressed:  N/A    Patient Evaluated for a Clinical Trial: No treatment clinical trial available for this patient.     [place] Cooperative Oncology Group performance status is 0, Fully active, able to carry on all pre-disease performance without restriction.Marland Kitchen     Physical Exam   Not performed due to telehealth    Assessment and Plan:  Doing well with history as stated above.  Counseled on typical colostomy management.  Will make followup PRN but we discussed issues for which she should call.    Deedra Ehrich, MD  Colon and Rectal Surgery  Pager: [phone]    Total time 15 minutes.  Estimated counseling time 12 minutes.  Reviewed as above.

## 2023-06-08 ENCOUNTER — Encounter: Admit: 2023-06-08 | Discharge: 2023-06-08 | Payer: MEDICARE

## 2023-06-11 ENCOUNTER — Ambulatory Visit: Admit: 2023-06-11 | Discharge: 2023-06-12 | Payer: MEDICARE

## 2023-06-11 ENCOUNTER — Encounter: Admit: 2023-06-11 | Discharge: 2023-06-11 | Payer: MEDICARE

## 2023-06-11 DIAGNOSIS — H43813 Vitreous degeneration, bilateral: Secondary | ICD-10-CM

## 2023-06-11 NOTE — Progress Notes
Assessment and Plan:    Problem   Vitreous Degeneration of Both Eyes       Vitreous degeneration of both eyes  Janice Gregory is referred today for visual symptoms after her cataract surgery and YPC. She states that she was diagnosed with cataract 3 years ago but waited to have cataract surgery until last summer. She had bilateral cataract surgery with Dr. Hazeline Junker in the summer of 2024 and immediately had an improvement in her vision. 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 She then subsequently had bilateral YAG laser capsulotomies in both eyes in November and December 2024.  Since that time, she has had persistent windshield wiper like floaters in both eyes, worse in the left, that look like large spiderwebs.  She is very bothered by the floaters in both eyes. SHe describes double vision which persists when she closes one eye or the other, and appears as the top of words look dark but the rest of the word is grey.  She also states that it feels like she is looking through a prism or facets .       I reviewed with her the exam findings, which do show vitreous degeneration and bilateral floaters, with a prominent posterior capsular remnant floating in the vitreous of the left eye after her YAP capsulotomy.  I explained that vitrectomy is an option to remove the floaters, but that all of her symptoms do not sound like they are entirely from floaters. It is not typical for patients to describe double vision from floaters.      I did explain that with time, most patients notice their floaters less and the larger floaters from [facility] can fall below the visual axis.  I reviewed the risks and benefits of surgery, particularly since she is seeing quite well in both eyes.  Given the relatively short period of time since the [facility], I have asked her to observe at this time and return in 6 weeks. If her symptoms have not improved, we will consider vitrectomy surgery.  I reiterated multiple times that some of her symptoms did NOT sound like typical floaters, and I want [place] to be clear on the expectations of what surgery can and CANNOT correct.   She has agreed to return in 6 weeks.    Lastly, I reviewed the signs and symptoms of retinal detachment and urged her to call or return to the ed if she experiences a sudden increase in floaters or a dark shadow in her vision.                       HPI:  Patient presents with:  Eye Exam: Patient presents today for New Patient Exam. Patient states over the past couple months she noticed been having distorted VA. She's says shortly after her Yag surgery in November she began seeing  flashes of light, OS. Shortly after she started seeing spider like floaters, OU.     Patient had cataract surgery, 2 days later she developed scar tissue and then caught after second surgery YPC OU    Left eye has never seen 20/20    Exam:  Base Eye Exam       Visual Acuity (Snellen - Linear)         Right Left    Dist cc 20/20 20/80 +1    Dist ph cc  20/25 -3  Tonometry ([employer] Tonometer, 3:32 PM)         Right Left    Pressure 12 10              Pupils         Dark Light Shape React APD    Right 3 2 Round Brisk None    Left 3 2 Round Brisk None              Visual Fields (Counting fingers)         Left Right     Full Full              Extraocular Movement         Right Left     Full Full              Neuro/Psych       Oriented x3: Yes    Mood/Affect: Normal              Dilation       Both eyes: 2.5% Phenylephrine, 1.0% Tropicamide @ 3:33 PM                  Slit Lamp and Fundus Exam       Slit Lamp Exam         Right Left    Lids/Lashes Normal Normal    Conjunctiva/Sclera White and quiet White and quiet    Cornea Clear Clear    Anterior Chamber Deep and quiet Deep and quiet    Iris Flat Flat    Lens PCIOL open capsul PCIOLopen PC    Vitreous syneresis syneresis no cell              Fundus Exam         Right Left    Disc Sharp healthy rim Sharp healthy rim    C/D Ratio 0.4 0.4    Macula Flat Flat    Vessels Normal caliber and number Normal caliber and number    Periphery Attached no breaks or tears Attached no breaks or tears

## 2023-06-11 NOTE — Assessment & Plan Note
Janice Gregory is referred today for visual symptoms after her cataract surgery and YPC. She states that she was diagnosed with cataract 3 years ago but waited to have cataract surgery until last summer. She had bilateral cataract surgery with Dr. Hazeline Junker in the summer of 2024 and immediately had an improvement in her vision. 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 She then subsequently had bilateral YAG laser capsulotomies in both eyes in November and December 2024.  Since that time, she has had persistent windshield wiper like floaters in both eyes, worse in the left, that look like large spiderwebs.  She is very bothered by the floaters in both eyes. SHe describes double vision which persists when she closes one eye or the other, and appears as the top of words look dark but the rest of the word is grey.  She also states that it feels like she is looking through a prism or facets .       I reviewed with her the exam findings, which do show vitreous degeneration and bilateral floaters, with a prominent posterior capsular remnant floating in the vitreous of the left eye after her YAP capsulotomy.  I explained that vitrectomy is an option to remove the floaters, but that all of her symptoms do not sound like they are entirely from floaters. It is not typical for patients to describe double vision from floaters.      I did explain that with time, most patients notice their floaters less and the larger floaters from [facility] can fall below the visual axis.  I reviewed the risks and benefits of surgery, particularly since she is seeing quite well in both eyes.  Given the relatively short period of time since the [facility], I have asked her to observe at this time and return in 6 weeks. If her symptoms have not improved, we will consider vitrectomy surgery.  I reiterated multiple times that some of her symptoms did NOT sound like typical floaters, and I want [place] to be clear on the expectations of what surgery can and CANNOT correct.   She has agreed to return in 6 weeks.    Lastly, I reviewed the signs and symptoms of retinal detachment and urged her to call or return to the ed if she experiences a sudden increase in floaters or a dark shadow in her vision.

## 2023-06-15 ENCOUNTER — Encounter: Admit: 2023-06-15 | Discharge: 2023-06-15 | Payer: MEDICARE

## 2023-07-31 ENCOUNTER — Encounter: Admit: 2023-07-31 | Discharge: 2023-07-31 | Payer: MEDICARE

## 2023-08-06 ENCOUNTER — Encounter: Admit: 2023-08-06 | Discharge: 2023-08-06

## 2023-08-06 ENCOUNTER — Ambulatory Visit: Admit: 2023-08-06 | Discharge: 2023-08-07

## 2023-08-06 DIAGNOSIS — H43813 Vitreous degeneration, bilateral: Secondary | ICD-10-CM

## 2023-08-06 NOTE — Progress Notes
 Assessment and Plan:    Problem   Vitreous Degeneration of Both Eyes       Vitreous degeneration of both eyes  Janice Gregory returns today with persistent symptomatic symptoms including a windshield wiper floater in her left eye.  We again reviewed the findings and the potential benefit of vitrectomy surgery for symptomatic floaters.  Again I told her that some of her symptoms, such as the double vision (present with either eye open) is not usually attributable to floaters. She has a long history of dry eye treated with restasis, xiida, serum tears, punctal plugs - and I explained that treating the dry eye may improve her sympotms. COnversely, surgery may worsen her dry eye symptoms in the short term.  Additionally, she has not had a PVD in the left eye yet (her more symptomatic eye), which may confer a slightly higher risk of retinal tear/detachment.    She has relatively good measured visual acuity (20/25) but states she cannot see to drive and has not driven in 3 years.      After revewing the risks and benefits of surgery, Janice Gregory is interested in surgery.  I explained that a second opinion may give her some insight into what she can expect for vision after surgery, as well as to evaluate for any other occult causes of her vision changes.  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                     HPI:  Patient presents with:  Eye Problem: Pt reports va is worse than last time, pt noticed pain in OD, pt reports flash of light and flouters, pt reports Double va, denied pain.           Exam:  Base Eye Exam       Visual Acuity (Snellen - Linear)         Right Left    Dist cc 20/25 20/25    Dist ph cc NI 20/25 +3              Tonometry (Applanation, 11:02 AM)         Right Left    Pressure 12 14              Pupils         Dark Shape APD    Right 3 Round None    Left 3 Round None              Dilation       Both eyes: 1.0% Tropicamide, 2.5% Phenylephrine @ 11:02 AM                  Slit Lamp and Fundus Exam       Slit Lamp Exam         Right Left    Lids/Lashes Normal Normal    Conjunctiva/Sclera White and quiet White and quiet    Cornea Clear Clear    Anterior Chamber Deep and quiet Deep and quiet    Iris Flat Flat    Lens PCIOL open capsule PCIOL open PC    Vitreous syneresis syneresis no cell              Fundus Exam         Right Left    Disc Sharp healthy rim Sharp healthy rim    C/D Ratio 0.4 0.4    Macula  Flat Flat    Vessels Normal caliber and number Normal caliber and number    Periphery Attached no breaks or tears, peripheral drusen Attached no breaks or tears on sde                    OCT MACULA          Macula  Right Eye  Macula findings: Normal foveal contour     Left Eye  Macula findings: Normal foveal contour (Vitreomacular adhesion)

## 2023-08-09 ENCOUNTER — Encounter: Admit: 2023-08-09 | Discharge: 2023-08-09

## 2023-08-09 NOTE — Telephone Encounter
 Referral and last chart note sent to [facility] for second opinion through [facility].  No further action required at this time

## 2023-09-03 ENCOUNTER — Encounter: Admit: 2023-09-03 | Discharge: 2023-09-03 | Payer: MEDICARE

## 2023-09-03 ENCOUNTER — Ambulatory Visit: Admit: 2023-09-03 | Discharge: 2023-09-03 | Payer: MEDICARE

## 2023-09-03 NOTE — Telephone Encounter
 Called Janice Gregory to f/u on 2nd opinion appt. Stated has an appt w/ [facility] 09/19/23. I asked after that appt if she could please request a copy of findings be sent to us  and give us  a quick call letting us  know been seen then I will share with Dr.Champion and get a plan in place. She appreciated my call and is anxiously waiting scheduling surgery to remove floaters affecting ADLs.

## 2024-01-02 ENCOUNTER — Encounter: Admit: 2024-01-02 | Discharge: 2024-01-02 | Payer: MEDICARE

## 2024-01-02 NOTE — Telephone Encounter
 We have scanned patients referral for Dr. Ruthellen from 09/26/23 into the chart.

## 2024-01-16 ENCOUNTER — Encounter: Admit: 2024-01-16 | Discharge: 2024-01-16 | Payer: MEDICARE

## 2024-02-15 ENCOUNTER — Encounter: Admit: 2024-02-15 | Discharge: 2024-02-15 | Payer: MEDICARE

## 2024-05-16 ENCOUNTER — Encounter: Admit: 2024-05-16 | Discharge: 2024-05-16 | Payer: MEDICARE
